# Patient Record
Sex: Male | Born: 1940 | Hispanic: No | Marital: Married | Smoking: Former smoker
Health system: Southern US, Community
[De-identification: ages and names within clinical notes are randomized; demographics above are authoritative.]

## PROBLEM LIST (undated history)

## (undated) DIAGNOSIS — D649 Anemia, unspecified: Secondary | ICD-10-CM

## (undated) DIAGNOSIS — Z8739 Personal history of other diseases of the musculoskeletal system and connective tissue: Secondary | ICD-10-CM

## (undated) DIAGNOSIS — I10 Essential (primary) hypertension: Secondary | ICD-10-CM

## (undated) DIAGNOSIS — D471 Chronic myeloproliferative disease: Secondary | ICD-10-CM

## (undated) DIAGNOSIS — I480 Paroxysmal atrial fibrillation: Secondary | ICD-10-CM

## (undated) HISTORY — DX: Personal history of other diseases of the musculoskeletal system and connective tissue: Z87.39

## (undated) HISTORY — DX: Chronic myeloproliferative disease: D47.1

## (undated) HISTORY — DX: Paroxysmal atrial fibrillation: I48.0

## (undated) HISTORY — DX: Essential (primary) hypertension: I10

## (undated) HISTORY — PX: OTHER SURGICAL HISTORY: SHX169

## (undated) HISTORY — DX: Anemia, unspecified: D64.9

---

## 2010-12-08 DIAGNOSIS — K319 Disease of stomach and duodenum, unspecified: Secondary | ICD-10-CM | POA: Insufficient documentation

## 2010-12-08 DIAGNOSIS — I10 Essential (primary) hypertension: Secondary | ICD-10-CM | POA: Insufficient documentation

## 2011-09-29 ENCOUNTER — Other Ambulatory Visit: Payer: Self-pay

## 2011-09-30 DIAGNOSIS — I4892 Unspecified atrial flutter: Secondary | ICD-10-CM

## 2011-10-01 DIAGNOSIS — I4892 Unspecified atrial flutter: Secondary | ICD-10-CM

## 2011-10-01 DIAGNOSIS — R079 Chest pain, unspecified: Secondary | ICD-10-CM

## 2011-10-19 ENCOUNTER — Other Ambulatory Visit: Payer: Self-pay | Admitting: *Deleted

## 2011-10-19 DIAGNOSIS — I4892 Unspecified atrial flutter: Secondary | ICD-10-CM

## 2011-10-22 DIAGNOSIS — I4892 Unspecified atrial flutter: Secondary | ICD-10-CM

## 2011-11-21 ENCOUNTER — Telehealth: Payer: Self-pay | Admitting: *Deleted

## 2011-11-21 ENCOUNTER — Encounter: Payer: Self-pay | Admitting: Cardiology

## 2011-11-21 NOTE — Telephone Encounter (Signed)
Wife informed

## 2011-11-21 NOTE — Telephone Encounter (Signed)
Message copied by Eustace Moore on Wed Nov 21, 2011  8:24 AM ------      Message from: Rande Brunt      Created: Tue Nov 20, 2011 11:04 AM       NSR

## 2011-11-27 ENCOUNTER — Ambulatory Visit: Payer: PRIVATE HEALTH INSURANCE | Admitting: Cardiology

## 2011-12-24 ENCOUNTER — Encounter: Payer: Self-pay | Admitting: Cardiology

## 2011-12-24 ENCOUNTER — Ambulatory Visit (INDEPENDENT_AMBULATORY_CARE_PROVIDER_SITE_OTHER): Payer: PRIVATE HEALTH INSURANCE | Admitting: Cardiology

## 2011-12-24 VITALS — BP 130/70 | HR 60 | Ht 76.0 in | Wt 230.0 lb

## 2011-12-24 DIAGNOSIS — I4891 Unspecified atrial fibrillation: Secondary | ICD-10-CM

## 2011-12-24 NOTE — Patient Instructions (Signed)
   No follow up needed. Your physician recommends that you continue on your current medications as directed. Please refer to the Current Medication list given to you today.

## 2011-12-24 NOTE — Progress Notes (Signed)
HPI The patient presents for followup of atrial fibrillation. He was admitted in May because of this. I reviewed extensively these hospital records. This was new onset. He had a low CHADS poor and so was not treated with anticoagulation. He did convert quickly to sinus rhythm once his rate was slowed with diltiazem. At that time he was anemic and this was thought to be perhaps a cause for his fibrillation. Was transfused what is a long standing chronic anemia. He did have some mild heart failure related to diastolic dysfunction.  Echo demonstrated preserved ejection fraction with no significant valvular abnormalities.  He did wear an event monitor post hospitalization which demonstrated no recurrent dysrhythmias.  Since his hospitalization he has had no further dysrhythmias. He denies any palpitations, presyncope or syncope. He has no chest pressure, neck or arm discomfort. He has had no weight gain or edema. He has had no shortness of breath, PND or orthopnea. He feels much better since his transfusions. He is followed in [place] for his anemia and by his primary provider and he reports having blood work since then.  No Known Allergies  Current Outpatient Prescriptions  Medication Sig Dispense Refill  . ALLOPURINOL PO Take 100 mg by mouth daily.       Marland Kitchen aspirin 81 MG tablet Take 81 mg by mouth daily.      . carvedilol (COREG) 12.5 MG tablet Take 12.5 mg by mouth 2 (two) times daily with a meal.      . ferrous fumarate (HEMOCYTE - 106 MG FE) 325 (106 FE) MG TABS Take 1 tablet by mouth daily.      . folic acid (FOLVITE) 1 MG tablet Take 1 mg by mouth daily.      . furosemide (LASIX) 20 MG tablet Take 40 mg by mouth daily as needed. Take 1-2 tablets daily as needed for edema       . LISINOPRIL PO Take 40 mg by mouth daily.       . Multiple Vitamin (MULTIVITAMIN) tablet Take 1 tablet by mouth daily.      Marland Kitchen PRAVASTATIN SODIUM PO Take 40 mg by mouth daily.       . Ranitidine HCl (ZANTAC PO) Take  75 mg by mouth daily.       . vitamin C (ASCORBIC ACID) 500 MG tablet Take 500 mg by mouth daily.        Past Medical History  Diagnosis Date  . Hypertension   . Anemia     He said teh cause has not been determined. His last transfusion requirement was about 4 years ago.  Marland Kitchen History of gout   . Paroxysmal atrial fibrillation     Past Surgical History  Procedure Date  . Right inguinal hernia repair     ROS:  As stated in the HPI and negative for all other systems.  PHYSICAL EXAM BP 130/70  Pulse 60  Ht 6\' 4"  (1.93 m)  Wt 230 lb (104.327 kg)  BMI 28.00 kg/m2 GENERAL:  Well appearing HEENT:  Pupils equal round and reactive, fundi not visualized, oral mucosa unremarkable NECK:  No jugular venous distention, waveform within normal limits, carotid upstroke brisk and symmetric, no bruits, no thyromegaly LYMPHATICS:  No cervical, inguinal adenopathy LUNGS:  Clear to auscultation bilaterally BACK:  No CVA tenderness CHEST:  Unremarkable HEART:  PMI not displaced or sustained,S1 and S2 within normal limits, no S3, no S4, no clicks, no rubs, no murmurs ABD:  Flat, positive bowel  sounds normal in frequency in pitch, no bruits, no rebound, no guarding, no midline pulsatile mass, no hepatomegaly, no splenomegaly EXT:  2 plus pulses throughout, no edema, no cyanosis no clubbing SKIN:  No rashes no nodules NEURO:  Cranial nerves II through XII grossly intact, motor grossly intact throughout PSYCH:  Cognitively intact, oriented to person place and time   ASSESSMENT AND PLAN  Atrial fibrillation -  The patient has had no recurrent dysrhythmias. He has a low thromboembolic risk. No further evaluation is indicated.  Diastolic heart failure  - The patient had evidence of this only related to his atrial fibrillation and profound anemia. He has no symptoms and is euvolemic. No further evaluation is necessary.  HTN - The blood pressure is at target. No change in medications is indicated. We  will continue with therapeutic lifestyle changes (TLC).

## 2012-01-15 ENCOUNTER — Telehealth: Payer: Self-pay | Admitting: Cardiology

## 2012-02-22 DIAGNOSIS — I5043 Acute on chronic combined systolic (congestive) and diastolic (congestive) heart failure: Secondary | ICD-10-CM

## 2012-03-13 ENCOUNTER — Encounter (INDEPENDENT_AMBULATORY_CARE_PROVIDER_SITE_OTHER): Payer: PRIVATE HEALTH INSURANCE | Admitting: Internal Medicine

## 2012-03-13 DIAGNOSIS — N289 Disorder of kidney and ureter, unspecified: Secondary | ICD-10-CM

## 2012-03-13 DIAGNOSIS — D509 Iron deficiency anemia, unspecified: Secondary | ICD-10-CM

## 2012-03-20 DIAGNOSIS — D509 Iron deficiency anemia, unspecified: Secondary | ICD-10-CM

## 2012-03-27 DIAGNOSIS — D509 Iron deficiency anemia, unspecified: Secondary | ICD-10-CM

## 2012-04-03 DIAGNOSIS — D509 Iron deficiency anemia, unspecified: Secondary | ICD-10-CM

## 2012-04-03 DIAGNOSIS — N189 Chronic kidney disease, unspecified: Secondary | ICD-10-CM

## 2012-04-18 ENCOUNTER — Other Ambulatory Visit: Payer: Self-pay

## 2012-05-01 DIAGNOSIS — I509 Heart failure, unspecified: Secondary | ICD-10-CM

## 2012-05-01 DIAGNOSIS — D509 Iron deficiency anemia, unspecified: Secondary | ICD-10-CM

## 2012-05-01 DIAGNOSIS — N189 Chronic kidney disease, unspecified: Secondary | ICD-10-CM

## 2012-05-01 DIAGNOSIS — D696 Thrombocytopenia, unspecified: Secondary | ICD-10-CM

## 2012-06-03 ENCOUNTER — Encounter (INDEPENDENT_AMBULATORY_CARE_PROVIDER_SITE_OTHER): Payer: PRIVATE HEALTH INSURANCE | Admitting: Internal Medicine

## 2012-06-03 DIAGNOSIS — D509 Iron deficiency anemia, unspecified: Secondary | ICD-10-CM

## 2012-06-03 DIAGNOSIS — D469 Myelodysplastic syndrome, unspecified: Secondary | ICD-10-CM

## 2012-06-03 DIAGNOSIS — N183 Chronic kidney disease, stage 3 (moderate): Secondary | ICD-10-CM

## 2012-06-10 DIAGNOSIS — D631 Anemia in chronic kidney disease: Secondary | ICD-10-CM

## 2012-06-10 DIAGNOSIS — N189 Chronic kidney disease, unspecified: Secondary | ICD-10-CM

## 2012-06-10 DIAGNOSIS — N039 Chronic nephritic syndrome with unspecified morphologic changes: Secondary | ICD-10-CM

## 2012-06-15 DIAGNOSIS — I509 Heart failure, unspecified: Secondary | ICD-10-CM | POA: Insufficient documentation

## 2012-06-15 DIAGNOSIS — E785 Hyperlipidemia, unspecified: Secondary | ICD-10-CM | POA: Insufficient documentation

## 2012-06-15 DIAGNOSIS — M109 Gout, unspecified: Secondary | ICD-10-CM | POA: Insufficient documentation

## 2012-06-15 DIAGNOSIS — I10 Essential (primary) hypertension: Secondary | ICD-10-CM | POA: Insufficient documentation

## 2012-06-25 ENCOUNTER — Encounter: Payer: Self-pay | Admitting: Internal Medicine

## 2012-07-16 ENCOUNTER — Encounter: Payer: Self-pay | Admitting: Internal Medicine

## 2012-07-16 DIAGNOSIS — D638 Anemia in other chronic diseases classified elsewhere: Secondary | ICD-10-CM

## 2012-07-16 DIAGNOSIS — N189 Chronic kidney disease, unspecified: Secondary | ICD-10-CM

## 2012-07-23 DIAGNOSIS — D469 Myelodysplastic syndrome, unspecified: Secondary | ICD-10-CM | POA: Insufficient documentation

## 2012-07-30 DIAGNOSIS — D638 Anemia in other chronic diseases classified elsewhere: Secondary | ICD-10-CM

## 2012-07-30 DIAGNOSIS — D4620 Refractory anemia with excess of blasts, unspecified: Secondary | ICD-10-CM

## 2012-08-06 DIAGNOSIS — N189 Chronic kidney disease, unspecified: Secondary | ICD-10-CM

## 2012-08-06 DIAGNOSIS — D631 Anemia in chronic kidney disease: Secondary | ICD-10-CM

## 2012-08-06 DIAGNOSIS — N039 Chronic nephritic syndrome with unspecified morphologic changes: Secondary | ICD-10-CM

## 2012-08-15 DIAGNOSIS — D638 Anemia in other chronic diseases classified elsewhere: Secondary | ICD-10-CM

## 2012-08-15 DIAGNOSIS — N189 Chronic kidney disease, unspecified: Secondary | ICD-10-CM

## 2012-08-15 DIAGNOSIS — D469 Myelodysplastic syndrome, unspecified: Secondary | ICD-10-CM

## 2012-08-22 ENCOUNTER — Encounter (INDEPENDENT_AMBULATORY_CARE_PROVIDER_SITE_OTHER): Payer: PRIVATE HEALTH INSURANCE

## 2012-08-22 DIAGNOSIS — D469 Myelodysplastic syndrome, unspecified: Secondary | ICD-10-CM

## 2012-08-22 DIAGNOSIS — M109 Gout, unspecified: Secondary | ICD-10-CM

## 2012-08-22 DIAGNOSIS — N189 Chronic kidney disease, unspecified: Secondary | ICD-10-CM

## 2012-08-22 DIAGNOSIS — I129 Hypertensive chronic kidney disease with stage 1 through stage 4 chronic kidney disease, or unspecified chronic kidney disease: Secondary | ICD-10-CM

## 2012-08-28 DIAGNOSIS — D631 Anemia in chronic kidney disease: Secondary | ICD-10-CM

## 2012-08-28 DIAGNOSIS — N189 Chronic kidney disease, unspecified: Secondary | ICD-10-CM

## 2012-08-28 DIAGNOSIS — N039 Chronic nephritic syndrome with unspecified morphologic changes: Secondary | ICD-10-CM

## 2012-09-11 ENCOUNTER — Encounter (INDEPENDENT_AMBULATORY_CARE_PROVIDER_SITE_OTHER): Payer: PRIVATE HEALTH INSURANCE | Admitting: Internal Medicine

## 2012-09-11 DIAGNOSIS — N039 Chronic nephritic syndrome with unspecified morphologic changes: Secondary | ICD-10-CM

## 2012-09-11 DIAGNOSIS — N184 Chronic kidney disease, stage 4 (severe): Secondary | ICD-10-CM

## 2012-09-11 DIAGNOSIS — D509 Iron deficiency anemia, unspecified: Secondary | ICD-10-CM

## 2012-09-11 DIAGNOSIS — D631 Anemia in chronic kidney disease: Secondary | ICD-10-CM

## 2012-10-02 DIAGNOSIS — D509 Iron deficiency anemia, unspecified: Secondary | ICD-10-CM

## 2012-10-02 DIAGNOSIS — N184 Chronic kidney disease, stage 4 (severe): Secondary | ICD-10-CM

## 2012-10-02 DIAGNOSIS — N039 Chronic nephritic syndrome with unspecified morphologic changes: Secondary | ICD-10-CM

## 2012-10-02 DIAGNOSIS — D631 Anemia in chronic kidney disease: Secondary | ICD-10-CM

## 2012-10-03 DIAGNOSIS — N184 Chronic kidney disease, stage 4 (severe): Secondary | ICD-10-CM | POA: Insufficient documentation

## 2012-10-03 DIAGNOSIS — I5022 Chronic systolic (congestive) heart failure: Secondary | ICD-10-CM | POA: Insufficient documentation

## 2012-10-03 DIAGNOSIS — I429 Cardiomyopathy, unspecified: Secondary | ICD-10-CM | POA: Insufficient documentation

## 2012-10-20 ENCOUNTER — Encounter (INDEPENDENT_AMBULATORY_CARE_PROVIDER_SITE_OTHER): Payer: PRIVATE HEALTH INSURANCE | Admitting: Internal Medicine

## 2012-10-20 DIAGNOSIS — D469 Myelodysplastic syndrome, unspecified: Secondary | ICD-10-CM

## 2012-10-20 DIAGNOSIS — D649 Anemia, unspecified: Secondary | ICD-10-CM

## 2012-10-20 DIAGNOSIS — N183 Chronic kidney disease, stage 3 unspecified: Secondary | ICD-10-CM

## 2012-10-20 DIAGNOSIS — D509 Iron deficiency anemia, unspecified: Secondary | ICD-10-CM

## 2012-12-15 ENCOUNTER — Encounter (INDEPENDENT_AMBULATORY_CARE_PROVIDER_SITE_OTHER): Payer: PRIVATE HEALTH INSURANCE

## 2012-12-15 DIAGNOSIS — I4891 Unspecified atrial fibrillation: Secondary | ICD-10-CM

## 2012-12-15 DIAGNOSIS — R161 Splenomegaly, not elsewhere classified: Secondary | ICD-10-CM

## 2012-12-15 DIAGNOSIS — D47Z9 Other specified neoplasms of uncertain behavior of lymphoid, hematopoietic and related tissue: Secondary | ICD-10-CM

## 2012-12-15 DIAGNOSIS — N189 Chronic kidney disease, unspecified: Secondary | ICD-10-CM

## 2012-12-15 DIAGNOSIS — Z7901 Long term (current) use of anticoagulants: Secondary | ICD-10-CM

## 2012-12-15 DIAGNOSIS — D649 Anemia, unspecified: Secondary | ICD-10-CM

## 2012-12-22 DIAGNOSIS — D649 Anemia, unspecified: Secondary | ICD-10-CM

## 2012-12-24 DIAGNOSIS — M6281 Muscle weakness (generalized): Secondary | ICD-10-CM

## 2012-12-29 DIAGNOSIS — D649 Anemia, unspecified: Secondary | ICD-10-CM

## 2013-01-05 DIAGNOSIS — N289 Disorder of kidney and ureter, unspecified: Secondary | ICD-10-CM

## 2013-01-05 DIAGNOSIS — D649 Anemia, unspecified: Secondary | ICD-10-CM

## 2013-01-15 ENCOUNTER — Encounter (INDEPENDENT_AMBULATORY_CARE_PROVIDER_SITE_OTHER): Payer: PRIVATE HEALTH INSURANCE

## 2013-01-15 DIAGNOSIS — N289 Disorder of kidney and ureter, unspecified: Secondary | ICD-10-CM

## 2013-01-15 DIAGNOSIS — D649 Anemia, unspecified: Secondary | ICD-10-CM

## 2013-01-22 DIAGNOSIS — N189 Chronic kidney disease, unspecified: Secondary | ICD-10-CM

## 2013-01-22 DIAGNOSIS — D649 Anemia, unspecified: Secondary | ICD-10-CM

## 2013-01-29 DIAGNOSIS — D649 Anemia, unspecified: Secondary | ICD-10-CM

## 2013-01-29 DIAGNOSIS — N189 Chronic kidney disease, unspecified: Secondary | ICD-10-CM

## 2013-02-05 ENCOUNTER — Encounter (INDEPENDENT_AMBULATORY_CARE_PROVIDER_SITE_OTHER): Payer: PRIVATE HEALTH INSURANCE

## 2013-02-05 DIAGNOSIS — D7581 Myelofibrosis: Secondary | ICD-10-CM

## 2013-02-05 DIAGNOSIS — R161 Splenomegaly, not elsewhere classified: Secondary | ICD-10-CM

## 2013-02-05 DIAGNOSIS — D649 Anemia, unspecified: Secondary | ICD-10-CM

## 2013-02-05 DIAGNOSIS — D696 Thrombocytopenia, unspecified: Secondary | ICD-10-CM

## 2013-02-12 DIAGNOSIS — D7581 Myelofibrosis: Secondary | ICD-10-CM

## 2013-02-12 DIAGNOSIS — D649 Anemia, unspecified: Secondary | ICD-10-CM

## 2013-02-12 DIAGNOSIS — N289 Disorder of kidney and ureter, unspecified: Secondary | ICD-10-CM

## 2013-02-19 DIAGNOSIS — N189 Chronic kidney disease, unspecified: Secondary | ICD-10-CM

## 2013-02-19 DIAGNOSIS — D649 Anemia, unspecified: Secondary | ICD-10-CM

## 2013-02-26 DIAGNOSIS — N189 Chronic kidney disease, unspecified: Secondary | ICD-10-CM

## 2013-02-26 DIAGNOSIS — D631 Anemia in chronic kidney disease: Secondary | ICD-10-CM

## 2013-02-26 DIAGNOSIS — N039 Chronic nephritic syndrome with unspecified morphologic changes: Secondary | ICD-10-CM

## 2013-03-04 ENCOUNTER — Encounter (INDEPENDENT_AMBULATORY_CARE_PROVIDER_SITE_OTHER): Payer: PRIVATE HEALTH INSURANCE

## 2013-03-04 DIAGNOSIS — R161 Splenomegaly, not elsewhere classified: Secondary | ICD-10-CM

## 2013-03-04 DIAGNOSIS — N189 Chronic kidney disease, unspecified: Secondary | ICD-10-CM

## 2013-03-04 DIAGNOSIS — D649 Anemia, unspecified: Secondary | ICD-10-CM

## 2013-03-04 DIAGNOSIS — D474 Osteomyelofibrosis: Secondary | ICD-10-CM

## 2013-03-04 DIAGNOSIS — N289 Disorder of kidney and ureter, unspecified: Secondary | ICD-10-CM

## 2013-03-12 DIAGNOSIS — N289 Disorder of kidney and ureter, unspecified: Secondary | ICD-10-CM

## 2013-03-12 DIAGNOSIS — D469 Myelodysplastic syndrome, unspecified: Secondary | ICD-10-CM

## 2013-03-19 ENCOUNTER — Encounter (INDEPENDENT_AMBULATORY_CARE_PROVIDER_SITE_OTHER): Payer: PRIVATE HEALTH INSURANCE

## 2013-03-19 DIAGNOSIS — N039 Chronic nephritic syndrome with unspecified morphologic changes: Secondary | ICD-10-CM

## 2013-03-19 DIAGNOSIS — N189 Chronic kidney disease, unspecified: Secondary | ICD-10-CM

## 2013-03-19 DIAGNOSIS — D649 Anemia, unspecified: Secondary | ICD-10-CM

## 2013-03-19 DIAGNOSIS — D631 Anemia in chronic kidney disease: Secondary | ICD-10-CM

## 2013-03-19 DIAGNOSIS — D474 Osteomyelofibrosis: Secondary | ICD-10-CM

## 2013-03-19 DIAGNOSIS — I428 Other cardiomyopathies: Secondary | ICD-10-CM

## 2013-03-19 DIAGNOSIS — N289 Disorder of kidney and ureter, unspecified: Secondary | ICD-10-CM

## 2013-03-27 DIAGNOSIS — N189 Chronic kidney disease, unspecified: Secondary | ICD-10-CM

## 2013-03-27 DIAGNOSIS — D649 Anemia, unspecified: Secondary | ICD-10-CM

## 2013-03-27 DIAGNOSIS — D7581 Myelofibrosis: Secondary | ICD-10-CM

## 2013-04-02 DIAGNOSIS — R161 Splenomegaly, not elsewhere classified: Secondary | ICD-10-CM

## 2013-04-02 DIAGNOSIS — D7581 Myelofibrosis: Secondary | ICD-10-CM

## 2013-04-03 DIAGNOSIS — N189 Chronic kidney disease, unspecified: Secondary | ICD-10-CM | POA: Insufficient documentation

## 2013-04-03 DIAGNOSIS — I2510 Atherosclerotic heart disease of native coronary artery without angina pectoris: Secondary | ICD-10-CM | POA: Insufficient documentation

## 2013-04-03 DIAGNOSIS — E119 Type 2 diabetes mellitus without complications: Secondary | ICD-10-CM | POA: Insufficient documentation

## 2013-04-08 DIAGNOSIS — D649 Anemia, unspecified: Secondary | ICD-10-CM

## 2013-04-08 DIAGNOSIS — D474 Osteomyelofibrosis: Secondary | ICD-10-CM

## 2013-04-21 ENCOUNTER — Encounter (INDEPENDENT_AMBULATORY_CARE_PROVIDER_SITE_OTHER): Payer: PRIVATE HEALTH INSURANCE

## 2013-04-21 DIAGNOSIS — N189 Chronic kidney disease, unspecified: Secondary | ICD-10-CM

## 2013-04-21 DIAGNOSIS — D7581 Myelofibrosis: Secondary | ICD-10-CM

## 2013-04-21 DIAGNOSIS — R161 Splenomegaly, not elsewhere classified: Secondary | ICD-10-CM

## 2013-04-21 DIAGNOSIS — R7989 Other specified abnormal findings of blood chemistry: Secondary | ICD-10-CM

## 2013-09-11 DIAGNOSIS — D6959 Other secondary thrombocytopenia: Secondary | ICD-10-CM | POA: Insufficient documentation

## 2013-11-18 DIAGNOSIS — E039 Hypothyroidism, unspecified: Secondary | ICD-10-CM | POA: Insufficient documentation

## 2014-02-24 DIAGNOSIS — E032 Hypothyroidism due to medicaments and other exogenous substances: Secondary | ICD-10-CM | POA: Insufficient documentation

## 2014-05-21 DIAGNOSIS — D474 Osteomyelofibrosis: Secondary | ICD-10-CM | POA: Insufficient documentation

## 2014-05-21 DIAGNOSIS — D471 Chronic myeloproliferative disease: Secondary | ICD-10-CM | POA: Insufficient documentation

## 2014-08-25 DIAGNOSIS — R161 Splenomegaly, not elsewhere classified: Secondary | ICD-10-CM | POA: Insufficient documentation

## 2014-10-14 DIAGNOSIS — I272 Pulmonary hypertension, unspecified: Secondary | ICD-10-CM | POA: Insufficient documentation

## 2014-10-14 DIAGNOSIS — G4730 Sleep apnea, unspecified: Secondary | ICD-10-CM | POA: Insufficient documentation

## 2014-10-15 DIAGNOSIS — I97190 Other postprocedural cardiac functional disturbances following cardiac surgery: Secondary | ICD-10-CM

## 2014-10-15 DIAGNOSIS — I442 Atrioventricular block, complete: Secondary | ICD-10-CM | POA: Insufficient documentation

## 2014-10-15 DIAGNOSIS — Z950 Presence of cardiac pacemaker: Secondary | ICD-10-CM | POA: Insufficient documentation

## 2014-12-24 DIAGNOSIS — D638 Anemia in other chronic diseases classified elsewhere: Secondary | ICD-10-CM | POA: Insufficient documentation

## 2015-02-24 DIAGNOSIS — I5042 Chronic combined systolic (congestive) and diastolic (congestive) heart failure: Secondary | ICD-10-CM | POA: Insufficient documentation

## 2015-06-21 DIAGNOSIS — I498 Other specified cardiac arrhythmias: Secondary | ICD-10-CM | POA: Insufficient documentation

## 2015-09-28 DIAGNOSIS — D649 Anemia, unspecified: Secondary | ICD-10-CM | POA: Insufficient documentation

## 2015-11-12 DIAGNOSIS — E872 Acidosis, unspecified: Secondary | ICD-10-CM | POA: Insufficient documentation

## 2015-11-25 ENCOUNTER — Encounter (HOSPITAL_COMMUNITY): Payer: Self-pay | Admitting: Hematology

## 2015-11-25 ENCOUNTER — Encounter (HOSPITAL_COMMUNITY): Payer: Medicare HMO | Attending: Hematology | Admitting: Hematology

## 2015-11-25 VITALS — BP 135/54 | HR 63 | Temp 97.7°F | Resp 18 | Ht 75.0 in | Wt 205.0 lb

## 2015-11-25 DIAGNOSIS — R161 Splenomegaly, not elsewhere classified: Secondary | ICD-10-CM

## 2015-11-25 DIAGNOSIS — Z9289 Personal history of other medical treatment: Secondary | ICD-10-CM | POA: Insufficient documentation

## 2015-11-25 DIAGNOSIS — D471 Chronic myeloproliferative disease: Secondary | ICD-10-CM | POA: Diagnosis not present

## 2015-11-25 DIAGNOSIS — Z8719 Personal history of other diseases of the digestive system: Secondary | ICD-10-CM | POA: Insufficient documentation

## 2015-11-25 DIAGNOSIS — D638 Anemia in other chronic diseases classified elsewhere: Secondary | ICD-10-CM

## 2015-11-25 DIAGNOSIS — D696 Thrombocytopenia, unspecified: Secondary | ICD-10-CM

## 2015-11-25 DIAGNOSIS — J45909 Unspecified asthma, uncomplicated: Secondary | ICD-10-CM | POA: Insufficient documentation

## 2015-11-25 DIAGNOSIS — D469 Myelodysplastic syndrome, unspecified: Secondary | ICD-10-CM | POA: Insufficient documentation

## 2015-11-25 DIAGNOSIS — I480 Paroxysmal atrial fibrillation: Secondary | ICD-10-CM

## 2015-11-25 DIAGNOSIS — I2510 Atherosclerotic heart disease of native coronary artery without angina pectoris: Secondary | ICD-10-CM

## 2015-11-25 DIAGNOSIS — N184 Chronic kidney disease, stage 4 (severe): Secondary | ICD-10-CM

## 2015-11-25 DIAGNOSIS — E039 Hypothyroidism, unspecified: Secondary | ICD-10-CM

## 2015-11-25 DIAGNOSIS — D731 Hypersplenism: Secondary | ICD-10-CM

## 2015-11-25 DIAGNOSIS — Z87891 Personal history of nicotine dependence: Secondary | ICD-10-CM

## 2015-11-25 DIAGNOSIS — D631 Anemia in chronic kidney disease: Secondary | ICD-10-CM

## 2015-11-25 DIAGNOSIS — E119 Type 2 diabetes mellitus without complications: Secondary | ICD-10-CM

## 2015-11-25 DIAGNOSIS — I5040 Unspecified combined systolic (congestive) and diastolic (congestive) heart failure: Secondary | ICD-10-CM

## 2015-11-25 DIAGNOSIS — Z9889 Other specified postprocedural states: Secondary | ICD-10-CM

## 2015-11-25 HISTORY — DX: Chronic myeloproliferative disease: D47.1

## 2015-11-25 NOTE — Patient Instructions (Addendum)
[place] at [facility] Discharge Instructions  RECOMMENDATIONS MADE BY THE CONSULTANT AND ANY TEST RESULTS WILL BE SENT TO YOUR REFERRING PHYSICIAN.  You were seen by Dr. Irene Limbo today. Return on Monday for labs You may need as needed Transfusions Return to Clinic every month with labs. Please call the clinic with any concerns.  Thank you for choosing [place] at [facility] to provide your oncology and hematology care.  To afford each patient quality time with our provider, please arrive at least 15 minutes before your scheduled appointment time.   Beginning January 23rd 2017 lab work for the [facility] will be done in the  Main lab at [facility] on 1st floor. If you have a lab appointment with the [facility] please come in thru the  Main Entrance and check in at the main information desk  You need to re-schedule your appointment should you arrive 10 or more minutes late.  We strive to give you quality time with our providers, and arriving late affects you and other patients whose appointments are after yours.  Also, if you no show three or more times for appointments you may be dismissed from the clinic at the providers discretion.     Again, thank you for choosing [facility].  Our hope is that these requests will decrease the amount of time that you wait before being seen by our physicians.       _____________________________________________________________  Should you have questions after your visit to [facility], please contact our office at [phone] between the hours of 8:30 a.m. and 4:30 p.m.  Voicemails left after 4:30 p.m. will not be returned until the following business day.  For prescription refill requests, have your pharmacy contact our office.         Resources For Cancer Patients and their Caregivers ? American Cancer Society: Can assist with transportation, wigs, general needs, runs  Look Good Feel Better.        ([phone] ? Cancer Care: Provides financial assistance, online support groups, medication/co-pay assistance.  1-800-813-HOPE (669)098-5216) ? [place] Assists [place] cancer patients and their families through emotional , educational and financial support.  201-611-0273 ? [place] DSS Where to apply for food stamps, Medicaid and utility assistance. 507-619-3000 ? RCATS: Transportation to medical appointments. (279) 205-3278 ? Social Security Administration: May apply for disability if have a Stage IV cancer. ([phone] [phone] ? [employer], Disability and Transit Services: Assists with nutrition, care and transit needs. [place] Support Programs: @10RELATIVEDAYS @ > Cancer Support Group  2nd Tuesday of the month 1pm-2pm, Journey Room  > Creative Journey  3rd Tuesday of the month 1130am-1pm, Journey Room  > Look Good Feel Better  1st Wednesday of the month 10am-12 noon, Journey Room (Call [place] to register [phone])

## 2015-11-28 ENCOUNTER — Encounter (HOSPITAL_COMMUNITY): Payer: Medicare HMO

## 2015-11-28 ENCOUNTER — Other Ambulatory Visit (HOSPITAL_COMMUNITY): Payer: Medicare HMO

## 2015-11-28 DIAGNOSIS — D469 Myelodysplastic syndrome, unspecified: Secondary | ICD-10-CM | POA: Diagnosis not present

## 2015-11-28 DIAGNOSIS — D471 Chronic myeloproliferative disease: Secondary | ICD-10-CM

## 2015-11-28 LAB — CBC WITH DIFFERENTIAL/PLATELET
BASOS ABS: 0 10*3/uL (ref 0.0–0.1)
BASOS PCT: 0 %
Eosinophils Absolute: 0 10*3/uL (ref 0.0–0.7)
Eosinophils Relative: 0 %
HEMATOCRIT: 24 % — AB (ref 39.0–52.0)
Hemoglobin: 7.7 g/dL — ABNORMAL LOW (ref 13.0–17.0)
LYMPHS PCT: 14 %
Lymphs Abs: 0.5 10*3/uL — ABNORMAL LOW (ref 0.7–4.0)
MCH: 29.3 pg (ref 26.0–34.0)
MCHC: 32.1 g/dL (ref 30.0–36.0)
MCV: 91.3 fL (ref 78.0–100.0)
MONOS PCT: 1 %
Monocytes Absolute: 0 10*3/uL — ABNORMAL LOW (ref 0.1–1.0)
NEUTROS ABS: 3.2 10*3/uL (ref 1.7–7.7)
Neutrophils Relative %: 85 %
PLATELETS: 78 10*3/uL — AB (ref 150–400)
RBC: 2.63 MIL/uL — ABNORMAL LOW (ref 4.22–5.81)
RDW: 26 % — ABNORMAL HIGH (ref 11.5–15.5)
WBC: 3.7 10*3/uL — ABNORMAL LOW (ref 4.0–10.5)

## 2015-11-28 LAB — COMPREHENSIVE METABOLIC PANEL
ALBUMIN: 2.9 g/dL — AB (ref 3.5–5.0)
ALT: 25 U/L (ref 17–63)
AST: 18 U/L (ref 15–41)
Alkaline Phosphatase: 73 U/L (ref 38–126)
Anion gap: 3 — ABNORMAL LOW (ref 5–15)
BUN: 66 mg/dL — AB (ref 6–20)
CHLORIDE: 118 mmol/L — AB (ref 101–111)
CO2: 18 mmol/L — AB (ref 22–32)
CREATININE: 2.85 mg/dL — AB (ref 0.61–1.24)
Calcium: 7.6 mg/dL — ABNORMAL LOW (ref 8.9–10.3)
GFR calc Af Amer: 24 mL/min — ABNORMAL LOW (ref >60)
GFR calc non Af Amer: 20 mL/min — ABNORMAL LOW (ref >60)
GLUCOSE: 123 mg/dL — AB (ref 65–99)
Potassium: 4.2 mmol/L (ref 3.5–5.1)
SODIUM: 139 mmol/L (ref 135–145)
Total Bilirubin: 0.4 mg/dL (ref 0.3–1.2)
Total Protein: 5.5 g/dL — ABNORMAL LOW (ref 6.5–8.1)

## 2015-11-28 LAB — FERRITIN: Ferritin: 2687 ng/mL — ABNORMAL HIGH (ref 24–336)

## 2015-11-28 LAB — LACTATE DEHYDROGENASE: LDH: 1205 U/L — ABNORMAL HIGH (ref 98–192)

## 2015-11-29 LAB — ERYTHROPOIETIN: Erythropoietin: 35.4 m[IU]/mL — ABNORMAL HIGH (ref 2.6–18.5)

## 2015-12-08 ENCOUNTER — Ambulatory Visit (HOSPITAL_COMMUNITY)
Admission: RE | Admit: 2015-12-08 | Discharge: 2015-12-08 | Disposition: A | Discharge status: Home / Self Care | Payer: Medicare HMO | Source: Ambulatory Visit | Attending: Hematology | Admitting: Hematology

## 2015-12-08 DIAGNOSIS — R188 Other ascites: Secondary | ICD-10-CM | POA: Diagnosis not present

## 2015-12-08 DIAGNOSIS — D469 Myelodysplastic syndrome, unspecified: Secondary | ICD-10-CM | POA: Insufficient documentation

## 2015-12-08 DIAGNOSIS — D471 Chronic myeloproliferative disease: Secondary | ICD-10-CM

## 2015-12-08 DIAGNOSIS — N281 Cyst of kidney, acquired: Secondary | ICD-10-CM | POA: Diagnosis not present

## 2015-12-26 ENCOUNTER — Encounter (HOSPITAL_COMMUNITY): Payer: Medicare HMO

## 2015-12-26 ENCOUNTER — Encounter (HOSPITAL_COMMUNITY): Payer: Self-pay | Admitting: Oncology

## 2015-12-26 ENCOUNTER — Encounter (HOSPITAL_COMMUNITY): Payer: Medicare HMO | Attending: Hematology | Admitting: Oncology

## 2015-12-26 DIAGNOSIS — D471 Chronic myeloproliferative disease: Secondary | ICD-10-CM | POA: Insufficient documentation

## 2015-12-26 DIAGNOSIS — R197 Diarrhea, unspecified: Secondary | ICD-10-CM

## 2015-12-26 DIAGNOSIS — D469 Myelodysplastic syndrome, unspecified: Secondary | ICD-10-CM

## 2015-12-26 DIAGNOSIS — R627 Adult failure to thrive: Secondary | ICD-10-CM

## 2015-12-26 DIAGNOSIS — K529 Noninfective gastroenteritis and colitis, unspecified: Secondary | ICD-10-CM

## 2015-12-26 DIAGNOSIS — R630 Anorexia: Secondary | ICD-10-CM

## 2015-12-26 LAB — COMPREHENSIVE METABOLIC PANEL
ALK PHOS: 74 U/L (ref 38–126)
ALT: 21 U/L (ref 17–63)
AST: 21 U/L (ref 15–41)
Albumin: 3.3 g/dL — ABNORMAL LOW (ref 3.5–5.0)
Anion gap: 8 (ref 5–15)
BILIRUBIN TOTAL: 0.7 mg/dL (ref 0.3–1.2)
BUN: 65 mg/dL — AB (ref 6–20)
CO2: 20 mmol/L — ABNORMAL LOW (ref 22–32)
CREATININE: 2.65 mg/dL — AB (ref 0.61–1.24)
Calcium: 8 mg/dL — ABNORMAL LOW (ref 8.9–10.3)
Chloride: 115 mmol/L — ABNORMAL HIGH (ref 101–111)
GFR calc Af Amer: 26 mL/min — ABNORMAL LOW (ref >60)
GFR, EST NON AFRICAN AMERICAN: 22 mL/min — AB (ref >60)
Glucose, Bld: 119 mg/dL — ABNORMAL HIGH (ref 65–99)
Potassium: 3.5 mmol/L (ref 3.5–5.1)
Sodium: 143 mmol/L (ref 135–145)
TOTAL PROTEIN: 6 g/dL — AB (ref 6.5–8.1)

## 2015-12-26 LAB — CBC WITH DIFFERENTIAL/PLATELET
BASOS PCT: 0 %
Basophils Absolute: 0 10*3/uL (ref 0.0–0.1)
EOS ABS: 0 10*3/uL (ref 0.0–0.7)
EOS PCT: 0 %
HEMATOCRIT: 32.1 % — AB (ref 39.0–52.0)
Hemoglobin: 10.3 g/dL — ABNORMAL LOW (ref 13.0–17.0)
LYMPHS PCT: 11 %
Lymphs Abs: 0.5 10*3/uL — ABNORMAL LOW (ref 0.7–4.0)
MCH: 29.4 pg (ref 26.0–34.0)
MCHC: 32.1 g/dL (ref 30.0–36.0)
MCV: 91.7 fL (ref 78.0–100.0)
Monocytes Absolute: 0.2 10*3/uL (ref 0.1–1.0)
Monocytes Relative: 4 %
NEUTROS PCT: 85 %
Neutro Abs: 3.6 10*3/uL (ref 1.7–7.7)
PLATELETS: 85 10*3/uL — AB (ref 150–400)
RBC: 3.5 MIL/uL — ABNORMAL LOW (ref 4.22–5.81)
RDW: 24.4 % — AB (ref 11.5–15.5)
WBC MORPHOLOGY: INCREASED
WBC: 4.3 10*3/uL (ref 4.0–10.5)

## 2015-12-26 LAB — FERRITIN: Ferritin: 3446 ng/mL — ABNORMAL HIGH (ref 24–336)

## 2015-12-26 MED ORDER — DEFERIPRONE 500 MG PO TABS: ORAL_TABLET | ORAL | 3 refills | Status: DC

## 2015-12-26 MED ORDER — LOPERAMIDE HCL 2 MG PO CAPS: 2.0000 mg | ORAL_CAPSULE | ORAL | 0 refills | Status: DC | PRN

## 2015-12-26 NOTE — Progress Notes (Signed)
[place] @ [facility] Telephone:([phone]) [phone]  Fax:(336) [place]  Nicolas Gibson OB: [birth year]  MR#: [id number]  GLO#:[id number]  Patient Care Team: [name], MD as PCP - General (Internal Medicine)  CHIEF COMPLAINT: No chief complaint on file.   VISIT DIAGNOSIS:     ICD-9-CM ICD-10-CM   1. 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 Diarrhea, unspecified type 787.91 R19.7 loperamide (IMODIUM) 2 MG capsule       Primary myelofibrosis ([place])   11/25/2015 Initial Diagnosis    Primary myelofibrosis (HCC)      No flowsheet data found.  INTERVAL HISTORY: 75 year old African-American gentleman came today to see [place] for chronic myeloproliferative disease which has been checked to positive.  Patient has been seen previously in this clinic and no notes are available at present time. So information has been off pain from [facility]. Patient has been seen previously at [facility] was admitted there for complete evaluation regarding chronic diarrhea and has been treated what appears to be antibiotic therapy for C. difficile (needs to be confirmed) Patient also has been previously treated   With JAKAFI and because of poor tolerance has been discontinued Patient is transfusion dependent had received 4 units of packed cells and [facility] (records are being obtained) Patient also has iron overload in getting ferriprox (according to the patient patient has been on for last 2 years) Feeling weak and tired poor appetite.  No nausea no vomiting.  Diarrhea persists. REVIEW OF SYSTEMS:   Gen. status: Feeling weak and tired. HEENT: Denies any headache dizziness. Cardiac: patient does have a pacemaker has a history of congestive heart failure GI: Abdominal discomfort.  Chronic diarrhea.  Previously responded to antibiotic therapy as well as to Imodium.  Had a complete workup done at [facility] trying to get all the  formation from [facility] regarding his admission in July. Lower extremity swelling Neurological system dizziness particularly when his hemoglobin goes down Skin: No rash    As per HPI. Otherwise, a complete review of systems is negatve.  PAST MEDICAL HISTORY: Past Medical History:  Diagnosis Date  . Anemia    He said teh cause has not been determined. His last transfusion requirement was about 4 years ago.  Marland Kitchen History of gout   . Hypertension   . Paroxysmal atrial fibrillation ([place])     PAST SURGICAL HISTORY: Past Surgical History:  Procedure Laterality Date  . Right inguinal hernia repair      FAMILY HISTORY Family History  Problem Relation Age of Onset  . Heart failure Mother   . Hypertension Mother   . Kidney failure Father   . Hypertension Father   . Diabetes Brother   . Hypertension Brother     GYNECOLOGIC HISTORY:  No LMP for male patient.     ADVANCED DIRECTIVES:    HEALTH MAINTENANCE: Social History  Substance Use Topics  . Smoking status: Former Smoker    Types: Cigarettes    Quit date: 05/14/1981  . Smokeless tobacco: Not on file  . Alcohol use No     Colonoscopy:  PAP:  Bone density:  Lipid panel:  Allergies  Allergen Reactions  . Lisinopril Other (See Comments)    hyperkalemia hyperkalemia    Current Outpatient Prescriptions  Medication Sig Dispense Refill  . allopurinol (ZYLOPRIM) 100 MG tablet Take by mouth.    Marland Kitchen aspirin EC 81 MG tablet Take by mouth.    Marland Kitchen  Deferiprone 500 MG TABS Take by mouth.    . folic acid (FOLVITE) 1 MG tablet Take by mouth.    . hydrALAZINE (APRESOLINE) 50 MG tablet     . isosorbide dinitrate (ISORDIL) 20 MG tablet Take by mouth.    . levothyroxine (SYNTHROID, LEVOTHROID) 88 MCG tablet Take by mouth.    . loperamide (IMODIUM) 2 MG capsule Take 1 capsule (2 mg total) by mouth every 4 (four) hours as needed for diarrhea or loose stools. 90 capsule 0  . metoprolol succinate (TOPROL-XL) 100 MG 24 hr  tablet     . pravastatin (PRAVACHOL) 40 MG tablet Take by mouth.    . ranitidine (ZANTAC) 150 MG tablet Take by mouth.    . sodium bicarbonate 650 MG tablet Take by mouth.    . torsemide (DEMADEX) 20 MG tablet Take by mouth.     No current facility-administered medications for this visit.     OBJECTIVE: PHYSICAL EXAM: Patient is alert oriented not any acute distress Lymphatic system: Supraclavicular, cervical, axillary, inguinal lymph nodes are not palpable HEENT: Pale looking sclerae are no jaundice Cardiac: Soft systolic murmur.  Irregular heart sound patient has pacemaker GI: Abdomen soft liver and spleen palpable Lower extremity edema Neurological system yet: No abnormality detected.. Skin: No rash All other systems have been examined  There were no vitals filed for this visit.   There is no height or weight on file to calculate BMI.    ECOG FS:2 - Symptomatic, <50% confined to bed  LAB RESULTS:  No visits with results within 5 Day(s) from this visit.  Latest known visit with results is:  Appointment on 11/28/2015  Component Date Value Ref Range Status  . WBC 11/28/2015 3.7* 4.0 - 10.5 K/uL Final  . RBC 11/28/2015 2.63* 4.22 - 5.81 MIL/uL Final  . Hemoglobin 11/28/2015 7.7* 13.0 - 17.0 g/dL Final  . HCT 11/28/2015 24.0* 39.0 - 52.0 % Final  . MCV 11/28/2015 91.3  78.0 - 100.0 fL Final  . MCH 11/28/2015 29.3  26.0 - 34.0 pg Final  . MCHC 11/28/2015 32.1  30.0 - 36.0 g/dL Final  . RDW 11/28/2015 26.0* 11.5 - 15.5 % Final  . Platelets 11/28/2015 78* 150 - 400 K/uL Final  . Neutrophils Relative % 11/28/2015 85  % Final  . Lymphocytes Relative 11/28/2015 14  % Final  . Monocytes Relative 11/28/2015 1  % Final  . Eosinophils Relative 11/28/2015 0  % Final  . Basophils Relative 11/28/2015 0  % Final  . Neutro Abs 11/28/2015 3.2  1.7 - 7.7 K/uL Final  . Lymphs Abs 11/28/2015 0.5* 0.7 - 4.0 K/uL Final  . Monocytes Absolute 11/28/2015 0.0* 0.1 - 1.0 K/uL Final  . Eosinophils  Absolute 11/28/2015 0.0  0.0 - 0.7 K/uL Final  . Basophils Absolute 11/28/2015 0.0  0.0 - 0.1 K/uL Final  . RBC Morphology 11/28/2015 POLYCHROMASIA PRESENT   Final   Comment: TEARDROP CELLS ELLIPTOCYTES SICKLE CELLS SCHISTOCYTES NOTED ON SMEAR   . WBC Morphology 11/28/2015 WHITE COUNT CONFIRMED ON SMEAR   Final  . Smear Review 11/28/2015 PLATELET COUNT CONFIRMED BY SMEAR   Final   Comment: LARGE PLATELETS PRESENT GIANT PLATELETS SEEN SPECIMEN CHECKED FOR CLOTS   . Sodium 11/28/2015 139  135 - 145 mmol/L Final  . Potassium 11/28/2015 4.2  3.5 - 5.1 mmol/L Final  . Chloride 11/28/2015 118* 101 - 111 mmol/L Final  . CO2 11/28/2015 18* 22 - 32 mmol/L Final  . Glucose, Bld 11/28/2015  123* 65 - 99 mg/dL Final  . BUN 11/28/2015 66* 6 - 20 mg/dL Final  . Creatinine, Ser 11/28/2015 2.85* 0.61 - 1.24 mg/dL Final  . Calcium 11/28/2015 7.6* 8.9 - 10.3 mg/dL Final  . Total Protein 11/28/2015 5.5* 6.5 - 8.1 g/dL Final  . Albumin 11/28/2015 2.9* 3.5 - 5.0 g/dL Final  . AST 11/28/2015 18  15 - 41 U/L Final  . ALT 11/28/2015 25  17 - 63 U/L Final  . Alkaline Phosphatase 11/28/2015 73  38 - 126 U/L Final  . Total Bilirubin 11/28/2015 0.4  0.3 - 1.2 mg/dL Final  . GFR calc non Af Amer 11/28/2015 20* >60 mL/min Final  . GFR calc Af Amer 11/28/2015 24* >60 mL/min Final   Comment: (NOTE) The eGFR has been calculated using the CKD EPI equation. This calculation has not been validated in all clinical situations. eGFR's persistently <60 mL/min signify possible Chronic Kidney Disease.   . Anion gap 11/28/2015 3* 5 - 15 Final  . LDH 11/28/2015 1205* 98 - 192 U/L Final  . Ferritin 11/28/2015 2687* 24 - 336 ng/mL Final  . Erythropoietin 11/29/2015 35.4* 2.6 - 18.5 mIU/mL Final   Comment: (NOTE) Performed At: [facility] [place], [place] [id number] Lindon Romp MD WU:[id number]      STUDIES: US Abdomen Complete  Result Date: 12/08/2015 CLINICAL DATA:  Iron overload,  myelodysplastic syndrome for the past 5 years, coronary artery disease, CHF, hyperlipidemia, diabetes. , follow-up splenomegaly EXAM: ABDOMEN ULTRASOUND COMPLETE COMPARISON:  Abdominal ultrasound of August 16, 2014 FINDINGS: Gallbladder: No gallstones or wall thickening visualized. No sonographic Murphy sign noted by sonographer. Common bile duct: Diameter: 6.9 mm Liver: The hepatic echotexture appears normal. A small amount of ascites lies adjacent to the liver. There is no focal hepatic mass nor ductal dilation. IVC: No abnormality visualized. Pancreas: Visualized portion unremarkable. Spleen: Further increase in size of the spleen which measures 18.4 x 19.3 x 9.7 cm with a calculated volume of 1806 cc. Right Kidney: Length: 11.7 cm. The renal cortical echotexture is increased and is equal tool that of the adjacent liver. There is a cortical cyst measuring 1 cm in diameter in the midpole. There is a irregular hypoechoic region in the upper pole measuring 2 x 1.7 x 1.1 cm. There is no hydronephrosis. Left Kidney: Length: 11.8 cm . The cortical echotexture is increased. There are vascular structures between the spleen and liver which may reflect splenorenal varices. Abdominal aorta: Limited visualization of the aorta. There is mural plaque. Other findings: None. IMPRESSION: 1. Interval increase in the volume of the spleen which now all measures 1806 cc. 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 2. Increased renal cortical echotexture bilaterally. Cystic structures in the right kidney with an upper pole complex appearing structure not well characterized. A dedicated renal ultrasound examination is recommended. 3. Normal appearance of the gallbladder and liver and pancreas. 4. Trace of ascites adjacent to the liver. Electronically Signed   By: David  Martinique M.D.   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 2.  Iron overload Continue present management 3.  Chronic diarrhea etiology not clear Records  are being obtained from [facility] for last admission for complete GI workup Will continue Imodium 5.  Failure to thrive and poor appetite related to multiple medical illnesses Patient will be evaluated in 3 weeks or before if needed  PLAN:  No problem-specific Assessment & Plan notes found for this encounter.   Patient expressed understanding and was in agreement with this plan. He also understands that He can call clinic at any time with any questions, concerns, or complaints.    No matching staging information was found for the patient.  Forest Gleason, MD   12/26/2015 2:17 PM

## 2015-12-26 NOTE — Patient Instructions (Addendum)
[place] at [facility] Discharge Instructions  RECOMMENDATIONS MADE BY THE CONSULTANT AND ANY TEST RESULTS WILL BE SENT TO YOUR REFERRING PHYSICIAN.  Labs today  Continue the Ferriprox.   Call us if you feel weak before coming back in 3 weeks.  Labs and MD visit in 3 weeks.   Take the Imodium for diarrhea as directed.  Thank you for choosing [place] at [facility] to provide your oncology and hematology care.  To afford each patient quality time with our provider, please arrive at least 15 minutes before your scheduled appointment time.   Beginning January 23rd 2017 lab work for the [facility] will be done in the  Main lab at [facility] on 1st floor. If you have a lab appointment with the [facility] please come in thru the  Main Entrance and check in at the main information desk  You need to re-schedule your appointment should you arrive 10 or more minutes late.  We strive to give you quality time with our providers, and arriving late affects you and other patients whose appointments are after yours.  Also, if you no show three or more times for appointments you may be dismissed from the clinic at the providers discretion.     Again, thank you for choosing [facility].  Our hope is that these requests will decrease the amount of time that you wait before being seen by our physicians.       _____________________________________________________________  Should you have questions after your visit to [facility], please contact our office at [phone] between the hours of 8:30 a.m. and 4:30 p.m.  Voicemails left after 4:30 p.m. will not be returned until the following business day.  For prescription refill requests, have your pharmacy contact our office.         Resources For Cancer Patients and their Caregivers ? American Cancer Society: Can assist with transportation, wigs, general needs, runs  Look Good Feel Better.        [phone] [phone] ? Cancer Care: Provides financial assistance, online support groups, medication/co-pay assistance.  1-800-813-HOPE 479 769 9413) ? [place] Assists [place] cancer patients and their families through emotional , educational and financial support.  979-371-1134 ? [place] DSS Where to apply for food stamps, Medicaid and utility assistance. (930)167-4477 ? RCATS: Transportation to medical appointments. 267-654-9846 ? Social Security Administration: May apply for disability if have a Stage IV cancer. ([phone] ? [employer], Disability and Transit Services: Assists with nutrition, care and transit needs. [place] Support Programs: @10RELATIVEDAYS @ > Cancer Support Group  2nd Tuesday of the month 1pm-2pm, Journey Room  > Creative Journey  3rd Tuesday of the month 1130am-1pm, Journey Room  > Look Good Feel Better  1st Wednesday of the month 10am-12 noon, Journey Room (Call [place] to register [phone])

## 2016-01-10 NOTE — Progress Notes (Signed)
Marland Kitchen    HEMATOLOGY/ONCOLOGY CONSULTATION NOTE  Date of Service: .11/25/2015  Patient Care Team: [name], MD as PCP - General (Internal Medicine)  CHIEF COMPLAINTS/PURPOSE OF CONSULTATION:   Continue management of primary myelofibrosis  HISTORY OF PRESENTING ILLNESS:   Nicolas Gibson is a wonderful 75 y.o. male who has been referred to [place] by Dr Becky Sax MD for evaluation and management of Primary myelofibrosis.  Patient has a history of Jak2 positive primary myelofibrosis diagnosed about 10 years ago and previously treated with Jakafi until about November 2016 when it was discontinued due to toxicity. He has a history of transfusion iron overload and is requiring 1-2 units of PRBCs monthly and is currently transfusion dependent. He is on deferiprone for his transfusion iron overload (with concurrent chronic kidney disease). He was previously being treated by Dr Janae Sauce and was last seen my him on 09/28/2015 .  he has also had chronic thrombocytopenia with the last platelet count being 82k in May 2017 . 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  Patient follows at [facility] for his cardiology cares for combined systolic and diastolic CHF and received his last PRBC transfusion of 2 units of PRBCs at [facility] on 11/12/2015.  Patient notes that he is doing okay today and does not have any overt distant exertion shortness of breath or chest pain or dizziness. His hemoglobin today is 7.7 with platelet count of 78k. No abdominal pain or overdistention at this time.   MEDICAL HISTORY:  Past Medical History:  Diagnosis Date  . Anemia    He said teh cause has not been determined. His last transfusion requirement was about 4 years ago.  Marland Kitchen History of gout   . Hypertension   . Paroxysmal atrial fibrillation (HCC)   -History of Jak2 positive primary myelofibrosis for 10 years - requiring PRBC transfusions for about the last 9 years. Currently  transfusion-dependent needing one to 2 units of PRBCs every month. 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   SURGICAL HISTORY: Past Surgical History:  Procedure Laterality Date  . Right inguinal hernia repair      SOCIAL HISTORY: Social History   Social History  . Marital status: Married    Spouse name: N/A  . Number of children: 1  . Years of education: N/A   Occupational History  . Retired    Social History Main Topics  . Smoking status: Former Smoker    Types: Cigarettes    Quit date: 05/14/1981  . Smokeless tobacco: Not on file  . Alcohol use No  . Drug use: No  . Sexual activity: Not on file   Other Topics Concern  . Not on file   Social History Narrative  . No narrative on file    FAMILY HISTORY: Family History  Problem Relation Age of Onset  . Heart failure Mother   . Hypertension Mother   . Kidney failure Father   . Hypertension Father   . Diabetes Brother   . Hypertension Brother     ALLERGIES:  is allergic to lisinopril.  MEDICATIONS:  Current Outpatient Prescriptions  Medication Sig Dispense Refill  . allopurinol (ZYLOPRIM) 100 MG tablet Take by mouth.    . folic acid (FOLVITE)  1 MG tablet Take by mouth.    . hydrALAZINE (APRESOLINE) 50 MG tablet     . isosorbide dinitrate (ISORDIL) 20 MG tablet Take by mouth.    . levothyroxine (SYNTHROID, LEVOTHROID) 88 MCG tablet Take by mouth.    . metoprolol succinate (TOPROL-XL) 100 MG 24 hr tablet     . pravastatin (PRAVACHOL) 40 MG tablet Take by mouth.    . sodium bicarbonate 650 MG tablet Take by mouth.    . torsemide (DEMADEX) 20 MG tablet Take by mouth.    Marland Kitchen aspirin EC 81  MG tablet Take by mouth.    . Deferiprone 500 MG TABS 5 tablets po twice a day 280 tablet 3  . loperamide (IMODIUM) 2 MG capsule Take 1 capsule (2 mg total) by mouth every 4 (four) hours as needed for diarrhea or loose stools. 90 capsule 0  . ranitidine (ZANTAC) 150 MG tablet Take by mouth.     No current facility-administered medications for this visit.     REVIEW OF SYSTEMS:    10 Point review of Systems was done is negative except as noted above.  PHYSICAL EXAMINATION: ECOG PERFORMANCE STATUS: 2 - Symptomatic, <50% confined to bed  . Vitals:   11/25/15 1400  BP: (!) 135/54  Pulse: 63  Resp: 18  Temp: 97.7 F (36.5 C)   Filed Weights   11/25/15 1400  Weight: 205 lb (93 kg)   .Body mass index is 25.62 kg/m.  GENERAL:alert, in no acute distress and comfortable SKIN: No acute rashes significant conjunctival pallor  EYES: normal, conjunctiva are pink and non-injected, sclera clear OROPHARYNX:no exudate, no erythema and lips, buccal mucosa, and tongue normal  NECK: supple, no JVD, thyroid normal size, non-tender, without nodularity LYMPH:  no palpable lymphadenopathy in the cervical, axillary or inguinal LUNGS: No rales no rhonchi HEART: regular rate & rhythm,  no murmurs and no lower extremity edema ABDOMEN: normoactive bowel sounds , spleen palpable about 3-4 cm under the left costal margin. Musculoskeletal: Trace pedal edema PSYCH: alert & oriented x 3 with fluent speech NEURO: no focal motor/sensory deficits  LABORATORY DATA:  I have reviewed the data as listed  Component     Latest Ref Rng & Units 11/28/2015  WBC     4.0 - 10.5 K/uL 3.7 (L)  RBC     4.22 - 5.81 MIL/uL 2.63 (L)  Hemoglobin     13.0 - 17.0 g/dL 7.7 (L)  HCT     39.0 - 52.0 % 24.0 (L)  MCV     78.0 - 100.0 fL 91.3  MCH     26.0 - 34.0 pg 29.3  MCHC     30.0 - 36.0 g/dL 32.1  RDW     11.5 - 15.5 % 26.0 (H)  Platelets     150 - 400 K/uL 78 (L)  Neutrophils     % 85  Lymphocytes     %  14  Monocytes Relative     % 1  Eosinophil     % 0  Basophil     % 0  NEUT#     1.7 - 7.7 K/uL 3.2  Lymphocyte #     0.7 - 4.0 K/uL 0.5 (L)  Monocyte #     0.1 - 1.0 K/uL 0.0 (L)  Eosinophils Absolute     0.0 - 0.7 K/uL 0.0  Basophils Absolute     0.0 - 0.1 K/uL 0.0  RBC Morphology  POLYCHROMASIA PRESENT  WBC Morphology      WHITE COUNT CONFIRMED ON SMEAR  Smear Review      PLATELET COUNT CONFIRMED BY SMEAR  Sodium     135 - 145 mmol/L 139  Potassium     3.5 - 5.1 mmol/L 4.2  Chloride     101 - 111 mmol/L 118 (H)  CO2     22 - 32 mmol/L 18 (L)  Glucose     65 - 99 mg/dL 123 (H)  BUN     6 - 20 mg/dL 66 (H)  Creatinine     0.61 - 1.24 mg/dL 2.85 (H)  Calcium     8.9 - 10.3 mg/dL 7.6 (L)  Total Protein     6.5 - 8.1 g/dL 5.5 (L)  Albumin     3.5 - 5.0 g/dL 2.9 (L)  AST     15 - 41 U/L 18  ALT     17 - 63 U/L 25  Alkaline Phosphatase     38 - 126 U/L 73  Total Bilirubin     0.3 - 1.2 mg/dL 0.4  EGFR (Non-African Amer.)     >60 mL/min 20 (L)  EGFR (African American)     >60 mL/min 24 (L)  Anion gap     5 - 15 3 (L)  LDH     98 - 192 U/L 1,205 (H)  Ferritin     24 - 336 ng/mL 2,687 (H)  Erythropoietin     2.6 - 18.5 mIU/mL 35.4 (H)    RADIOGRAPHIC STUDIES: I have personally reviewed the radiological images as listed and agreed with the findings in the report. No results found.  ASSESSMENT & PLAN:   75 year old very pleasant African-American male with multiple medical comorbidities with  #1 Jak2 positive primary myelofibrosis for 10 years - requiring PRBC transfusions for about the last 9 years.  Currently transfusion-dependent needing one to 2 units of PRBCs every month. Was previously on Jakafi but has been off of this since November 2016 due to intolerance and additional cytopenias.  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 -Splenomegaly due to primary  myelofibrosis -Thrombocytopenia due to hypersplenism Plan -Will get labs today to clinic CBC, CMP, ferritin, type and screen. -Patient had his last transfusion with 2 units of PRBCs on 11/12/2015 at [facility] -He would likely not tolerate anemia as well as most others due to his significant CHF and other medical comorbidities. -Erythropoietin level is low might consider erythropoietin given a part of his anemia could be from has significant chronic kidney disease. After weighing the risk and benefits of this since the patient also has A. fib and CHF. -Continue goals of care discussion -Ultrasound of the abdomen to evaluate spleen size might be indeterminate and potentially considering the use of Jak2 inhibitors. - continue labs every 4 weeks and transfuse when necessary as needed for some dramatic anemia or hemoglobin less than 8  #2 Transfusional iron overload  --on Deferiprone 258m po BID.  --goal is ferritin of <1000 --continue to monitor liver functions and blood counts.  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 Plan continue follow-up with primary care physician and cardiology   Return to clinic in 4 weeks with repeat labs with Dr PWhitney Muse  All of the patients and his wife's  questions were answered to their apparent satisfaction. The patient knows to call the clinic with any problems, questions or concerns.  I spent 60 minutes counseling the patient face to face. The total time spent in the appointment was 60 minutes and more than 50% was on counseling and direct patient cares.    Sullivan Lone MD [place] AAHIVMS [facility] Hematology/Oncology Physician [facility]  (Office):       [phone] (Work cell):  [phone] (Fax):           [phone]

## 2016-01-19 ENCOUNTER — Other Ambulatory Visit (HOSPITAL_COMMUNITY): Payer: Self-pay

## 2016-01-19 DIAGNOSIS — D469 Myelodysplastic syndrome, unspecified: Secondary | ICD-10-CM

## 2016-01-19 MED ORDER — DEFERIPRONE 500 MG PO TABS: ORAL_TABLET | ORAL | 3 refills | Status: DC

## 2016-01-20 ENCOUNTER — Encounter (HOSPITAL_COMMUNITY): Payer: Medicare HMO | Attending: Hematology & Oncology | Admitting: Hematology & Oncology

## 2016-01-20 ENCOUNTER — Encounter (HOSPITAL_COMMUNITY): Payer: Medicare HMO

## 2016-01-20 VITALS — BP 155/73 | HR 57 | Temp 97.5°F | Resp 18 | Wt 204.2 lb

## 2016-01-20 DIAGNOSIS — K529 Noninfective gastroenteritis and colitis, unspecified: Secondary | ICD-10-CM | POA: Insufficient documentation

## 2016-01-20 DIAGNOSIS — D649 Anemia, unspecified: Secondary | ICD-10-CM | POA: Diagnosis not present

## 2016-01-20 DIAGNOSIS — D471 Chronic myeloproliferative disease: Secondary | ICD-10-CM | POA: Diagnosis not present

## 2016-01-20 DIAGNOSIS — Z7982 Long term (current) use of aspirin: Secondary | ICD-10-CM | POA: Diagnosis not present

## 2016-01-20 DIAGNOSIS — Z87891 Personal history of nicotine dependence: Secondary | ICD-10-CM | POA: Diagnosis not present

## 2016-01-20 DIAGNOSIS — I130 Hypertensive heart and chronic kidney disease with heart failure and stage 1 through stage 4 chronic kidney disease, or unspecified chronic kidney disease: Secondary | ICD-10-CM | POA: Insufficient documentation

## 2016-01-20 DIAGNOSIS — Z79899 Other long term (current) drug therapy: Secondary | ICD-10-CM | POA: Insufficient documentation

## 2016-01-20 DIAGNOSIS — R197 Diarrhea, unspecified: Secondary | ICD-10-CM

## 2016-01-20 DIAGNOSIS — Z1589 Genetic susceptibility to other disease: Secondary | ICD-10-CM

## 2016-01-20 DIAGNOSIS — D474 Osteomyelofibrosis: Secondary | ICD-10-CM | POA: Insufficient documentation

## 2016-01-20 DIAGNOSIS — E1122 Type 2 diabetes mellitus with diabetic chronic kidney disease: Secondary | ICD-10-CM | POA: Diagnosis not present

## 2016-01-20 DIAGNOSIS — D469 Myelodysplastic syndrome, unspecified: Secondary | ICD-10-CM

## 2016-01-20 DIAGNOSIS — N184 Chronic kidney disease, stage 4 (severe): Secondary | ICD-10-CM | POA: Diagnosis not present

## 2016-01-20 DIAGNOSIS — I5042 Chronic combined systolic (congestive) and diastolic (congestive) heart failure: Secondary | ICD-10-CM | POA: Insufficient documentation

## 2016-01-20 LAB — CBC WITH DIFFERENTIAL/PLATELET
BASOS PCT: 0 %
Basophils Absolute: 0 10*3/uL (ref 0.0–0.1)
EOS ABS: 0 10*3/uL (ref 0.0–0.7)
EOS PCT: 1 %
HCT: 24.1 % — ABNORMAL LOW (ref 39.0–52.0)
HEMOGLOBIN: 7.8 g/dL — AB (ref 13.0–17.0)
LYMPHS ABS: 0.7 10*3/uL (ref 0.7–4.0)
Lymphocytes Relative: 16 %
MCH: 29 pg (ref 26.0–34.0)
MCHC: 32.4 g/dL (ref 30.0–36.0)
MCV: 89.6 fL (ref 78.0–100.0)
Monocytes Absolute: 0 10*3/uL — ABNORMAL LOW (ref 0.1–1.0)
Monocytes Relative: 1 %
Neutro Abs: 3.6 10*3/uL (ref 1.7–7.7)
Neutrophils Relative %: 83 %
PLATELETS: 92 10*3/uL — AB (ref 150–400)
RBC: 2.69 MIL/uL — AB (ref 4.22–5.81)
RDW: 25.1 % — ABNORMAL HIGH (ref 11.5–15.5)
WBC: 4.4 10*3/uL (ref 4.0–10.5)

## 2016-01-20 LAB — COMPREHENSIVE METABOLIC PANEL
ALT: 18 U/L (ref 17–63)
AST: 18 U/L (ref 15–41)
Albumin: 3.1 g/dL — ABNORMAL LOW (ref 3.5–5.0)
Alkaline Phosphatase: 70 U/L (ref 38–126)
Anion gap: 2 — ABNORMAL LOW (ref 5–15)
BUN: 64 mg/dL — AB (ref 6–20)
CHLORIDE: 112 mmol/L — AB (ref 101–111)
CO2: 22 mmol/L (ref 22–32)
Calcium: 7.6 mg/dL — ABNORMAL LOW (ref 8.9–10.3)
Creatinine, Ser: 3.14 mg/dL — ABNORMAL HIGH (ref 0.61–1.24)
GFR, EST AFRICAN AMERICAN: 21 mL/min — AB (ref >60)
GFR, EST NON AFRICAN AMERICAN: 18 mL/min — AB (ref >60)
Glucose, Bld: 140 mg/dL — ABNORMAL HIGH (ref 65–99)
POTASSIUM: 3.1 mmol/L — AB (ref 3.5–5.1)
SODIUM: 136 mmol/L (ref 135–145)
Total Bilirubin: 0.6 mg/dL (ref 0.3–1.2)
Total Protein: 5.9 g/dL — ABNORMAL LOW (ref 6.5–8.1)

## 2016-01-20 LAB — FERRITIN: FERRITIN: 2686 ng/mL — AB (ref 24–336)

## 2016-01-20 NOTE — Progress Notes (Addendum)
[place]  PROGRESS NOTE  Nicolas Gibson OB: 06-15-1940  MR#: [id number]  XLK#:[id number]  Patient Care Team: [name], MD as PCP - General (Internal Medicine)  VISIT DIAGNOSIS:   No diagnosis found.     Primary myelofibrosis ([place])   11/25/2015 Initial Diagnosis    Primary myelofibrosis ([place])      12/08/2015 Imaging    Abdominal Ultrasound shows interval increase in the volume of the spleen which now measures 1806 cc. 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 Increased renal cortical echotexture bilaterally. Cystic structures in the right kidney with an upper pole complex appearing structure not well characterized. Normal appearance of the gallbladder and liver and pancreas. Trace of ascites adjacent to the liver.        No flowsheet data found.  INTERVAL HISTORY:  Nicolas Gibson is a wonderful 75 y.o. male who has been referred to [place] by Dr Becky Sax MD for evaluation and management of Primary myelofibrosis. Patient has a history of Jak2 positive primary myelofibrosis diagnosed about 10 years ago and previously treated with Jakafi until about November 2016 when it was discontinued due to toxicity. He has a history of transfusion iron overload and is requiring 1-2 units of PRBCs monthly and is currently transfusion dependent. He is on deferiprone for his transfusion iron overload (with concurrent chronic kidney disease). He was previously being treated by Dr Janae Sauce and was last seen my him on 09/28/2015 .  he has also had chronic thrombocytopenia with the last platelet count being 82k in May 2017 . 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  Patient follows at [facility] for his cardiology cares for combined systolic and diastolic CHF and received his last PRBC transfusion of 2 units of PRBCs at [facility] on 11/12/2015.  Patient notes that he is doing okay today and does not have any overt distant exertion shortness of breath or  chest pain or dizziness. He was seen by nephrology at [facility] on 8/29.  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  Nicolas Gibson is accompanied by his wife. I personally reviewed and went over laboratory studies with the patient.   His stomach has been feeling "messed up". He isn't sure if it is a virus. This has been going on for the last 3 months. He reports a little vomiting, as well as diarrhea. Before he was given "that pill", he would have 3 to 4 bowel movements daily. With the pill, he goes about once a day. If he stops the pill his bowel movements increase again. Referring to his stomach, "It feels like there's water in it". He has been referred to meet with a GI physician by his PCP.  If his stomach didn't bother him, he would feel pretty good. He does not feel that weak. He feels pretty good, other than his stomach.   He reports his legs are sore from swelling. He reports certain foods do not taste the same.   Patient states Dr. Jacquiline Doe started him on a shot a year ago to help him make blood. "It didn't do no good". His wife notes he'd go every two weeks for the shot and continued to need blood transfusions.  He normally received transfusions once monthly. He normally receives 2 units. He can tell when his blood counts are low. Dr. Jacquiline Doe would check his blood monthly and have a follow up appointment every 3 months.    REVIEW OF SYSTEMS:   Gen.  status: Negative. HEENT: Denies any headache dizziness. Cardiac: Patient does have a pacemaker has a history of congestive heart failure GI: Positive for diarrhea and vomiting. Severe diarrhea managed with medication Neurological : Negative Skin: No rash 14 point review of systems was performed and is negative except as detailed under history of present illness and above   As per HPI. Otherwise, a complete review of systems is negatve.  PAST MEDICAL HISTORY: Past Medical History:  Diagnosis Date  . Anemia    He said teh  cause has not been determined. His last transfusion requirement was about 4 years ago.  Marland Kitchen History of gout   . Hypertension   . Paroxysmal atrial fibrillation (HCC)   History of Jak2 positive primary myelofibrosis for 10 years - requiring PRBC transfusions for about the last 9 years. Currently transfusion-dependent needing one to 2 units of PRBCs every month. 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  PAST SURGICAL HISTORY: Past Surgical History:  Procedure Laterality Date  . Right inguinal hernia repair      FAMILY HISTORY Family History  Problem Relation Age of Onset  . Heart failure Mother   . Hypertension Mother   . Kidney failure Father   . Hypertension Father   . Diabetes Brother   . Hypertension Brother      HEALTH MAINTENANCE: Social History  Substance Use Topics  . Smoking status: Former Smoker    Types: Cigarettes    Quit date: 05/14/1981  . Smokeless tobacco: Not on file  . Alcohol use No     Allergies  Allergen Reactions  . Lisinopril Other (See Comments)    hyperkalemia hyperkalemia    Current Outpatient Prescriptions  Medication Sig Dispense Refill  . allopurinol (ZYLOPRIM) 100 MG tablet Take by mouth.    .Marland Kitchenaspirin EC 81 MG tablet Take by mouth.    . Deferiprone 500 MG TABS 5 tablets po twice a day 3580tablet 3  . folic acid (FOLVITE) 1 MG tablet Take by mouth.    . hydrALAZINE (APRESOLINE) 50 MG tablet     . isosorbide dinitrate (ISORDIL) 20 MG tablet Take by mouth.    . levothyroxine (SYNTHROID, LEVOTHROID) 88 MCG tablet Take by mouth.    . loperamide (IMODIUM) 2 MG capsule Take 1 capsule (2 mg total) by mouth every 4 (four)  hours as needed for diarrhea or loose stools. 90 capsule 0  . metoprolol succinate (TOPROL-XL) 100 MG 24 hr tablet     . pravastatin (PRAVACHOL) 40 MG tablet Take by mouth.    . ranitidine (ZANTAC) 150 MG tablet Take by mouth.    . sodium bicarbonate 650 MG tablet Take by mouth.    . torsemide (DEMADEX) 20 MG tablet Take by mouth.     No current facility-administered medications for this visit.     OBJECTIVE: BP (!) 155/73 (BP Location: Left Arm, Patient Position: Sitting)   Pulse (!) 57   Temp 97.5 F (36.4 C) (Oral)   Resp 18   Wt 204 lb 3.2 oz (92.6 kg)   SpO2 98%   BMI 25.52 kg/m   PHYSICAL EXAM: Patient is alert oriented not any acute distress Lymphatic system: Supraclavicular, cervical, axillary, inguinal lymph nodes are not palpable HEENT: Pale looking sclerae,  no scleral icterus, oropharnx clear Cardiac: Soft systolic murmur.  Irregular heart sound patient has pacemaker, S1/S2 audible PULM: CTAB, no wheezing or rhonchi GI: Abdomen soft liver and spleen palpable Neuro: Grossly intact. Onto exam table without assistance. CN II - XII intact.  No abnormality detected. Psych: alert and oriented X 3 with fluent speech, appropriate Musculoskeletal: No gross joint deformity, trace pedal edema Skin: No rash, ? Vitiligo on face, neck All other systems have been examined   LAB RESULTS:  Appointment on 01/20/2016  Component Date Value Ref Range Status  . WBC 01/20/2016 4.4  4.0 - 10.5 K/uL Final  . RBC 01/20/2016 2.69* 4.22 - 5.81 MIL/uL Final  . Hemoglobin 01/20/2016 7.8* 13.0 - 17.0 g/dL Final  . HCT 01/20/2016 24.1* 39.0 - 52.0 % Final  . MCV 01/20/2016 89.6  78.0 - 100.0 fL Final  . MCH 01/20/2016 29.0  26.0 - 34.0 pg Final  . MCHC 01/20/2016 32.4  30.0 - 36.0 g/dL Final  . RDW 01/20/2016 25.1* 11.5 - 15.5 % Final  . Platelets 01/20/2016 92* 150 - 400 K/uL Final   Comment: SPECIMEN CHECKED FOR CLOTS PLATELET COUNT CONFIRMED BY SMEAR LARGE PLATELETS PRESENT   .  Neutrophils Relative % 01/20/2016 83  % Final  . Neutro Abs 01/20/2016 3.6  1.7 - 7.7 K/uL Final  . Lymphocytes Relative 01/20/2016 16  % Final  . Lymphs Abs 01/20/2016 0.7  0.7 - 4.0 K/uL Final  . Monocytes Relative 01/20/2016 1  % Final  . Monocytes Absolute 01/20/2016 0.0* 0.1 - 1.0 K/uL Final  . Eosinophils Relative 01/20/2016 1  % Final  . Eosinophils Absolute 01/20/2016 0.0  0.0 - 0.7 K/uL Final  . Basophils Relative 01/20/2016 0  % Final  . Basophils Absolute 01/20/2016 0.0  0.0 - 0.1 K/uL Final  . Sodium 01/20/2016 136  135 - 145 mmol/L Final  . Potassium 01/20/2016 3.1* 3.5 - 5.1 mmol/L Final  . Chloride 01/20/2016 112* 101 - 111 mmol/L Final  . CO2 01/20/2016 22  22 - 32 mmol/L Final  . Glucose, Bld 01/20/2016 140* 65 - 99 mg/dL Final  . BUN 01/20/2016 64* 6 - 20 mg/dL Final  . Creatinine, Ser 01/20/2016 3.14* 0.61 - 1.24 mg/dL Final  . Calcium 01/20/2016 7.6* 8.9 - 10.3 mg/dL Final  . Total Protein 01/20/2016 5.9* 6.5 - 8.1 g/dL Final  . Albumin 01/20/2016 3.1* 3.5 - 5.0 g/dL Final  . AST 01/20/2016 18  15 - 41 U/L Final  . ALT 01/20/2016 18  17 - 63 U/L Final  . Alkaline Phosphatase 01/20/2016 70  38 - 126 U/L Final  . Total Bilirubin 01/20/2016 0.6  0.3 - 1.2 mg/dL Final  . GFR calc non Af Amer 01/20/2016 18* >60 mL/min Final  . GFR calc Af Amer 01/20/2016 21* >60 mL/min Final   Comment: (NOTE) The eGFR has been calculated using the CKD EPI equation. This calculation has not been validated in all clinical situations. eGFR's persistently <60 mL/min signify possible Chronic Kidney Disease.   . Anion gap 01/20/2016 2* 5 - 15 Final     RADIOLOGY: I have reviewed the images below and agree with the reported results Study Result   CLINICAL DATA:  Iron overload, myelodysplastic syndrome for the past 5 years, coronary artery disease, CHF, hyperlipidemia, diabetes. , follow-up splenomegaly EXAM: ABDOMEN ULTRASOUND COMPLETE COMPARISON:  Abdominal ultrasound of August 16, 2014 FINDINGS: Gallbladder: No gallstones or wall thickening visualized. No sonographic Murphy sign noted by sonographer. Common  bile duct: Diameter: 6.9 mm Liver: The hepatic echotexture appears normal. A small amount of ascites lies adjacent to the liver. There is no focal hepatic mass nor ductal dilation. IVC: No abnormality visualized. Pancreas: Visualized portion unremarkable. Spleen: Further increase in size of the spleen which measures 18.4 x 19.3 x 9.7 cm with a calculated volume of 1806 cc. Right Kidney: Length: 11.7 cm. The renal cortical echotexture is increased and is equal tool that of the adjacent liver. There is a cortical cyst measuring 1 cm in diameter in the midpole. There is a irregular hypoechoic region in the upper pole measuring 2 x 1.7 x 1.1 cm. There is no hydronephrosis. Left Kidney: Length: 11.8 cm . The cortical echotexture is increased. There are vascular structures between the spleen and liver which may reflect splenorenal varices. Abdominal aorta: Limited visualization of the aorta. There is mural plaque. Other findings: None. IMPRESSION: 1. Interval increase in the volume of the spleen which now all measures 1806 cc. 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 2. Increased renal cortical echotexture bilaterally. Cystic structures in the right kidney with an upper pole complex appearing structure not well characterized. A dedicated renal ultrasound examination is recommended. 3. Normal appearance of the gallbladder and liver and pancreas. 4. Trace of ascites adjacent to the liver. Electronically Signed   By: David  Martinique M.D.   On: 12/08/2015 11:31    No results found.  ASSESSMENT:   Chronic myelofibrosis with Jak 2 positive disease According to the notes available patient had poor tolerance to [place] Patient is transfusion dependent and had transfusion of 4 units of packed cells [facility] last week (records are being obtained)  Iron  overload Chronic diarrhea with history C-diff CKD, stage IV   He will continue with current medications. He is to see GI next week for his diarrhea and abdominal complaints. He has been referred by his Internist.  He would like to get back on his prior schedule from [facility], I will set him up for PRBC transfusion in 2 weeks, he knows to call if he begins to feel symptomatic and we can transfuse sooner. We will then begin to check CBC's monthly. He will return for follow-up in 2 months.  He does not need any refills at this time.  He has been referred to GI by his PCP to discuss abdominal issues.   Patient expressed understanding and was in agreement with this plan. He also understands that He can call clinic at any time with any questions, concerns, or complaints.   FROM [facility]        This document serves as a record of services personally performed by Ancil Linsey, MD. It was created on her behalf by Arlyce Harman, a trained medical scribe. The creation of this record is based on the scribe's personal observations and the provider's statements to them. This document has been checked and approved by the attending provider.  I have reviewed the above documentation for accuracy and completeness and I agree with the above.  Molli Hazard, MD    01/20/2016 9:52 AM

## 2016-01-20 NOTE — Patient Instructions (Addendum)
[place] at [facility] Discharge Instructions  RECOMMENDATIONS MADE BY THE CONSULTANT AND ANY TEST RESULTS WILL BE SENT TO YOUR REFERRING PHYSICIAN.  You saw Dr. Whitney Muse today. Labs on 9/18 with transfusion on 9/19. Labs monthly starting in October. Follow up in 2 months with labs.  Thank you for choosing [place] at [facility] to provide your oncology and hematology care.  To afford each patient quality time with our provider, please arrive at least 15 minutes before your scheduled appointment time.   Beginning January 23rd 2017 lab work for the [facility] will be done in the  Main lab at [facility] on 1st floor. If you have a lab appointment with the [place] please come in thru the  Main Entrance and check in at the main information desk  You need to re-schedule your appointment should you arrive 10 or more minutes late.  We strive to give you quality time with our providers, and arriving late affects you and other patients whose appointments are after yours.  Also, if you no show three or more times for appointments you may be dismissed from the clinic at the providers discretion.     Again, thank you for choosing [facility].  Our hope is that these requests will decrease the amount of time that you wait before being seen by our physicians.       _____________________________________________________________  Should you have questions after your visit to [facility], please contact our office at [phone] between the hours of 8:30 a.m. and 4:30 p.m.  Voicemails left after 4:30 p.m. will not be returned until the following business day.  For prescription refill requests, have your pharmacy contact our office.         Resources For Cancer Patients and their Caregivers ? American Cancer Society: Can assist with transportation, wigs, general needs, runs Look Good Feel Better.         418-086-4409 ? Cancer Care: Provides financial assistance, online support groups, medication/co-pay assistance.  1-800-813-HOPE 579 471 9845) ? [place] Assists [place] cancer patients and their families through emotional , educational and financial support.  206 215 2041 ? [place] DSS Where to apply for food stamps, Medicaid and utility assistance. 506-642-0183 ? RCATS: Transportation to medical appointments. (213)800-2747 ? Social Security Administration: May apply for disability if have a Stage IV cancer. (517)268-1488 845-460-6713 ? [employer], Disability and Transit Services: Assists with nutrition, care and transit needs. [place] Support Programs: @10RELATIVEDAYS @ > Cancer Support Group  2nd Tuesday of the month 1pm-2pm, Journey Room  > Creative Journey  3rd Tuesday of the month 1130am-1pm, Journey Room  > Look Good Feel Better  1st Wednesday of the month 10am-12 noon, Journey Room (Call [place] to register [phone])

## 2016-01-21 ENCOUNTER — Encounter (HOSPITAL_COMMUNITY): Payer: Self-pay | Admitting: Hematology & Oncology

## 2016-01-23 DIAGNOSIS — D696 Thrombocytopenia, unspecified: Secondary | ICD-10-CM | POA: Insufficient documentation

## 2016-01-23 DIAGNOSIS — R188 Other ascites: Secondary | ICD-10-CM | POA: Insufficient documentation

## 2016-01-25 ENCOUNTER — Other Ambulatory Visit (HOSPITAL_COMMUNITY): Payer: Self-pay | Admitting: Oncology

## 2016-01-25 MED ORDER — DEFERIPRONE 500 MG PO TABS: ORAL_TABLET | ORAL | 3 refills | Status: DC

## 2016-01-30 ENCOUNTER — Other Ambulatory Visit (HOSPITAL_COMMUNITY): Payer: Medicare HMO

## 2016-01-30 ENCOUNTER — Encounter (HOSPITAL_COMMUNITY): Payer: Medicare HMO

## 2016-01-30 ENCOUNTER — Other Ambulatory Visit (HOSPITAL_COMMUNITY): Payer: Self-pay | Admitting: *Deleted

## 2016-01-30 DIAGNOSIS — D649 Anemia, unspecified: Secondary | ICD-10-CM | POA: Diagnosis not present

## 2016-01-30 DIAGNOSIS — D474 Osteomyelofibrosis: Secondary | ICD-10-CM

## 2016-01-30 DIAGNOSIS — D638 Anemia in other chronic diseases classified elsewhere: Secondary | ICD-10-CM

## 2016-01-30 DIAGNOSIS — D471 Chronic myeloproliferative disease: Secondary | ICD-10-CM

## 2016-01-31 ENCOUNTER — Encounter (HOSPITAL_COMMUNITY): Payer: Self-pay

## 2016-01-31 ENCOUNTER — Encounter (HOSPITAL_COMMUNITY): Payer: Medicare HMO

## 2016-01-31 ENCOUNTER — Encounter (HOSPITAL_COMMUNITY)
Admission: RE | Admit: 2016-01-31 | Discharge: 2016-01-31 | Disposition: A | Discharge status: Home / Self Care | Payer: Medicare HMO | Source: Ambulatory Visit | Attending: Hematology & Oncology | Admitting: Hematology & Oncology

## 2016-01-31 DIAGNOSIS — D471 Chronic myeloproliferative disease: Secondary | ICD-10-CM | POA: Insufficient documentation

## 2016-01-31 DIAGNOSIS — D638 Anemia in other chronic diseases classified elsewhere: Secondary | ICD-10-CM | POA: Diagnosis not present

## 2016-01-31 LAB — CBC WITH DIFFERENTIAL/PLATELET
BASOS PCT: 1 %
Basophils Absolute: 0 10*3/uL (ref 0.0–0.1)
Eosinophils Absolute: 0 10*3/uL (ref 0.0–0.7)
Eosinophils Relative: 0 %
HCT: 22.4 % — ABNORMAL LOW (ref 39.0–52.0)
Hemoglobin: 7.1 g/dL — ABNORMAL LOW (ref 13.0–17.0)
LYMPHS PCT: 15 %
Lymphs Abs: 0.6 10*3/uL — ABNORMAL LOW (ref 0.7–4.0)
MCH: 29.5 pg (ref 26.0–34.0)
MCHC: 31.7 g/dL (ref 30.0–36.0)
MCV: 92.9 fL (ref 78.0–100.0)
MONO ABS: 0.1 10*3/uL (ref 0.1–1.0)
Monocytes Relative: 3 %
NEUTROS PCT: 81 %
Neutro Abs: 3 10*3/uL (ref 1.7–7.7)
PLATELETS: 96 10*3/uL — AB (ref 150–400)
RBC: 2.41 MIL/uL — ABNORMAL LOW (ref 4.22–5.81)
RDW: 26.4 % — ABNORMAL HIGH (ref 11.5–15.5)
WBC: 3.7 10*3/uL — ABNORMAL LOW (ref 4.0–10.5)

## 2016-01-31 LAB — PREPARE RBC (CROSSMATCH)

## 2016-01-31 LAB — ABO/RH: ABO/RH(D): O POS

## 2016-01-31 MED ORDER — SODIUM CHLORIDE 0.9 % IV SOLN
250.0000 mL | Freq: Once | INTRAVENOUS | Status: AC
  Administered 2016-01-31: 250 mL via INTRAVENOUS

## 2016-01-31 MED ORDER — HEPARIN SOD (PORK) LOCK FLUSH 100 UNIT/ML IV SOLN: 500.0000 [IU] | Freq: Every day | INTRAVENOUS | Status: DC | PRN

## 2016-01-31 MED ORDER — ACETAMINOPHEN 325 MG PO TABS
650.0000 mg | ORAL_TABLET | Freq: Once | ORAL | Status: AC
  Administered 2016-01-31: 650 mg via ORAL
  Filled 2016-01-31: qty 2

## 2016-01-31 MED ORDER — SODIUM CHLORIDE 0.9% FLUSH: 10.0000 mL | INTRAVENOUS | Status: DC | PRN

## 2016-01-31 MED ORDER — DIPHENHYDRAMINE HCL 25 MG PO CAPS
25.0000 mg | ORAL_CAPSULE | Freq: Once | ORAL | Status: AC
  Administered 2016-01-31: 25 mg via ORAL
  Filled 2016-01-31: qty 1

## 2016-02-01 ENCOUNTER — Other Ambulatory Visit (HOSPITAL_COMMUNITY): Payer: Medicare HMO

## 2016-02-01 LAB — TYPE AND SCREEN
ABO/RH(D): O POS
ANTIBODY SCREEN: NEGATIVE
UNIT DIVISION: 0
Unit division: 0

## 2016-02-06 ENCOUNTER — Other Ambulatory Visit (HOSPITAL_COMMUNITY): Payer: Medicare HMO

## 2016-02-07 ENCOUNTER — Encounter (HOSPITAL_COMMUNITY): Payer: Medicare HMO

## 2016-02-24 ENCOUNTER — Other Ambulatory Visit (HOSPITAL_COMMUNITY): Payer: Self-pay | Admitting: *Deleted

## 2016-02-24 DIAGNOSIS — D638 Anemia in other chronic diseases classified elsewhere: Secondary | ICD-10-CM

## 2016-02-24 DIAGNOSIS — D6959 Other secondary thrombocytopenia: Secondary | ICD-10-CM

## 2016-02-28 ENCOUNTER — Encounter (HOSPITAL_COMMUNITY): Payer: Medicare HMO | Attending: Hematology & Oncology

## 2016-02-28 DIAGNOSIS — E876 Hypokalemia: Secondary | ICD-10-CM | POA: Insufficient documentation

## 2016-02-28 DIAGNOSIS — D474 Osteomyelofibrosis: Secondary | ICD-10-CM | POA: Insufficient documentation

## 2016-02-28 DIAGNOSIS — D6959 Other secondary thrombocytopenia: Secondary | ICD-10-CM

## 2016-02-28 DIAGNOSIS — D649 Anemia, unspecified: Secondary | ICD-10-CM

## 2016-02-28 DIAGNOSIS — D469 Myelodysplastic syndrome, unspecified: Secondary | ICD-10-CM | POA: Insufficient documentation

## 2016-02-28 DIAGNOSIS — D638 Anemia in other chronic diseases classified elsewhere: Secondary | ICD-10-CM

## 2016-02-28 LAB — COMPREHENSIVE METABOLIC PANEL
ALBUMIN: 2.7 g/dL — AB (ref 3.5–5.0)
ALT: 22 U/L (ref 17–63)
ANION GAP: 9 (ref 5–15)
AST: 30 U/L (ref 15–41)
Alkaline Phosphatase: 84 U/L (ref 38–126)
BILIRUBIN TOTAL: 0.6 mg/dL (ref 0.3–1.2)
BUN: 58 mg/dL — ABNORMAL HIGH (ref 6–20)
CHLORIDE: 112 mmol/L — AB (ref 101–111)
CO2: 21 mmol/L — AB (ref 22–32)
Calcium: 7.8 mg/dL — ABNORMAL LOW (ref 8.9–10.3)
Creatinine, Ser: 3.03 mg/dL — ABNORMAL HIGH (ref 0.61–1.24)
GFR calc Af Amer: 22 mL/min — ABNORMAL LOW (ref >60)
GFR calc non Af Amer: 19 mL/min — ABNORMAL LOW (ref >60)
GLUCOSE: 131 mg/dL — AB (ref 65–99)
POTASSIUM: 3.1 mmol/L — AB (ref 3.5–5.1)
SODIUM: 142 mmol/L (ref 135–145)
TOTAL PROTEIN: 5.5 g/dL — AB (ref 6.5–8.1)

## 2016-02-28 LAB — CBC WITH DIFFERENTIAL/PLATELET
BASOS ABS: 0 10*3/uL (ref 0.0–0.1)
BASOS PCT: 0 %
EOS ABS: 0 10*3/uL (ref 0.0–0.7)
EOS PCT: 1 %
HCT: 22 % — ABNORMAL LOW (ref 39.0–52.0)
Hemoglobin: 7.1 g/dL — ABNORMAL LOW (ref 13.0–17.0)
Lymphocytes Relative: 11 %
Lymphs Abs: 0.5 10*3/uL — ABNORMAL LOW (ref 0.7–4.0)
MCH: 30.7 pg (ref 26.0–34.0)
MCHC: 32.3 g/dL (ref 30.0–36.0)
MCV: 95.2 fL (ref 78.0–100.0)
MONO ABS: 0.1 10*3/uL (ref 0.1–1.0)
MONOS PCT: 2 %
Neutro Abs: 4.2 10*3/uL (ref 1.7–7.7)
Neutrophils Relative %: 86 %
PLATELETS: 101 10*3/uL — AB (ref 150–400)
RBC: 2.31 MIL/uL — ABNORMAL LOW (ref 4.22–5.81)
RDW: 28.7 % — AB (ref 11.5–15.5)
WBC: 4.9 10*3/uL (ref 4.0–10.5)

## 2016-02-28 LAB — FERRITIN: Ferritin: 2894 ng/mL — ABNORMAL HIGH (ref 24–336)

## 2016-02-28 LAB — SAMPLE TO BLOOD BANK

## 2016-02-28 LAB — PREPARE RBC (CROSSMATCH)

## 2016-02-29 ENCOUNTER — Encounter (HOSPITAL_BASED_OUTPATIENT_CLINIC_OR_DEPARTMENT_OTHER): Payer: Medicare HMO

## 2016-02-29 VITALS — BP 160/78 | HR 50 | Temp 97.3°F | Resp 16

## 2016-02-29 DIAGNOSIS — D469 Myelodysplastic syndrome, unspecified: Secondary | ICD-10-CM

## 2016-02-29 DIAGNOSIS — D474 Osteomyelofibrosis: Secondary | ICD-10-CM

## 2016-02-29 DIAGNOSIS — R197 Diarrhea, unspecified: Secondary | ICD-10-CM

## 2016-02-29 DIAGNOSIS — D649 Anemia, unspecified: Secondary | ICD-10-CM

## 2016-02-29 DIAGNOSIS — I272 Pulmonary hypertension, unspecified: Secondary | ICD-10-CM

## 2016-02-29 DIAGNOSIS — E876 Hypokalemia: Secondary | ICD-10-CM

## 2016-02-29 MED ORDER — ACETAMINOPHEN 325 MG PO TABS
ORAL_TABLET | ORAL | Status: AC
  Filled 2016-02-29: qty 2

## 2016-02-29 MED ORDER — SODIUM CHLORIDE 0.9 % IV SOLN
250.0000 mL | Freq: Once | INTRAVENOUS | Status: AC
  Administered 2016-02-29: 250 mL via INTRAVENOUS

## 2016-02-29 MED ORDER — LOPERAMIDE HCL 2 MG PO TABS
2.0000 mg | ORAL_TABLET | ORAL | Status: AC
  Administered 2016-02-29: 2 mg via ORAL
  Filled 2016-02-29 (×2): qty 1

## 2016-02-29 MED ORDER — POTASSIUM CHLORIDE CRYS ER 20 MEQ PO TBCR: 20.0000 meq | EXTENDED_RELEASE_TABLET | Freq: Every day | ORAL | 2 refills | Status: DC

## 2016-02-29 MED ORDER — ACETAMINOPHEN 325 MG PO TABS
650.0000 mg | ORAL_TABLET | Freq: Once | ORAL | Status: AC
  Administered 2016-02-29: 650 mg via ORAL

## 2016-02-29 MED ORDER — FUROSEMIDE 10 MG/ML IJ SOLN
40.0000 mg | Freq: Once | INTRAMUSCULAR | Status: AC
  Administered 2016-02-29: 40 mg via INTRAVENOUS
  Filled 2016-02-29: qty 4

## 2016-02-29 MED ORDER — DIPHENHYDRAMINE HCL 25 MG PO CAPS
ORAL_CAPSULE | ORAL | Status: AC
  Filled 2016-02-29: qty 1

## 2016-02-29 MED ORDER — SODIUM CHLORIDE 0.9% FLUSH
10.0000 mL | INTRAVENOUS | Status: AC | PRN
  Administered 2016-02-29: 10 mL

## 2016-02-29 MED ORDER — DIPHENHYDRAMINE HCL 25 MG PO CAPS
25.0000 mg | ORAL_CAPSULE | Freq: Once | ORAL | Status: AC
  Administered 2016-02-29: 25 mg via ORAL

## 2016-02-29 NOTE — Patient Instructions (Signed)
[place] at [facility] Discharge Instructions  RECOMMENDATIONS MADE BY THE CONSULTANT AND ANY TEST RESULTS WILL BE SENT TO YOUR REFERRING PHYSICIAN.  Today you received a blood transfusion of 2 units packed red blood cells. You received Lasix 40 mg IV post transfusion. Return as scheduled.  Thank you for choosing [place] at [facility] to provide your oncology and hematology care.  To afford each patient quality time with our provider, please arrive at least 15 minutes before your scheduled appointment time.   Beginning January 23rd 2017 lab work for the [facility] will be done in the  Main lab at [facility] on 1st floor. If you have a lab appointment with the [place] please come in thru the  Main Entrance and check in at the main information desk  You need to re-schedule your appointment should you arrive 10 or more minutes late.  We strive to give you quality time with our providers, and arriving late affects you and other patients whose appointments are after yours.  Also, if you no show three or more times for appointments you may be dismissed from the clinic at the providers discretion.     Again, thank you for choosing [facility].  Our hope is that these requests will decrease the amount of time that you wait before being seen by our physicians.       _____________________________________________________________  Should you have questions after your visit to [facility], please contact our office at [phone] between the hours of 8:30 a.m. and 4:30 p.m.  Voicemails left after 4:30 p.m. will not be returned until the following business day.  For prescription refill requests, have your pharmacy contact our office.         Resources For Cancer Patients and their Caregivers ? American Cancer Society: Can assist with transportation, wigs, general needs, runs Look Good Feel Better.         718-470-1585 ? Cancer Care: Provides financial assistance, online support groups, medication/co-pay assistance.  1-800-813-HOPE (787)853-8054) ? [place] Assists [place] cancer patients and their families through emotional , educational and financial support.  (571)674-1885 ? [place] DSS Where to apply for food stamps, Medicaid and utility assistance. 252-018-6450 ? RCATS: Transportation to medical appointments. 863 811 1515 ? Social Security Administration: May apply for disability if have a Stage IV cancer. 726-024-4612 4166123888 ? [employer], Disability and Transit Services: Assists with nutrition, care and transit needs. [place] Support Programs: @10RELATIVEDAYS @ > Cancer Support Group  2nd Tuesday of the month 1pm-2pm, Journey Room  > Creative Journey  3rd Tuesday of the month 1130am-1pm, Journey Room  > Look Good Feel Better  1st Wednesday of the month 10am-12 noon, Journey Room (Call [place] to register [phone])  to

## 2016-02-29 NOTE — Progress Notes (Signed)
Tolerated blood transfusion well. Stable and ambulatory on discharge home with wife. 

## 2016-03-01 LAB — TYPE AND SCREEN
ABO/RH(D): O POS
ANTIBODY SCREEN: NEGATIVE
Unit division: 0
Unit division: 0

## 2016-03-23 ENCOUNTER — Other Ambulatory Visit (HOSPITAL_COMMUNITY): Payer: Medicare HMO

## 2016-03-23 ENCOUNTER — Ambulatory Visit (HOSPITAL_COMMUNITY): Payer: Medicare HMO | Admitting: Hematology & Oncology

## 2016-03-24 NOTE — Progress Notes (Signed)
This encounter was created in error - please disregard.

## 2016-03-29 ENCOUNTER — Other Ambulatory Visit (HOSPITAL_COMMUNITY): Payer: Self-pay | Admitting: Oncology

## 2016-03-29 DIAGNOSIS — D474 Osteomyelofibrosis: Secondary | ICD-10-CM

## 2016-04-02 ENCOUNTER — Other Ambulatory Visit (HOSPITAL_COMMUNITY): Payer: Self-pay

## 2016-04-02 ENCOUNTER — Encounter (HOSPITAL_COMMUNITY): Payer: Self-pay

## 2016-04-02 ENCOUNTER — Encounter (HOSPITAL_COMMUNITY): Payer: Medicare HMO | Attending: Hematology & Oncology

## 2016-04-02 DIAGNOSIS — E876 Hypokalemia: Secondary | ICD-10-CM | POA: Diagnosis not present

## 2016-04-02 DIAGNOSIS — D469 Myelodysplastic syndrome, unspecified: Secondary | ICD-10-CM | POA: Insufficient documentation

## 2016-04-02 DIAGNOSIS — R197 Diarrhea, unspecified: Secondary | ICD-10-CM

## 2016-04-02 DIAGNOSIS — D649 Anemia, unspecified: Secondary | ICD-10-CM

## 2016-04-02 DIAGNOSIS — D474 Osteomyelofibrosis: Secondary | ICD-10-CM | POA: Diagnosis not present

## 2016-04-02 DIAGNOSIS — D508 Other iron deficiency anemias: Secondary | ICD-10-CM

## 2016-04-02 LAB — CBC WITH DIFFERENTIAL/PLATELET
Basophils Absolute: 0 10*3/uL (ref 0.0–0.1)
Basophils Relative: 0 %
EOS ABS: 0 10*3/uL (ref 0.0–0.7)
EOS PCT: 0 %
HCT: 22.4 % — ABNORMAL LOW (ref 39.0–52.0)
Hemoglobin: 7.2 g/dL — ABNORMAL LOW (ref 13.0–17.0)
LYMPHS ABS: 0.7 10*3/uL (ref 0.7–4.0)
Lymphocytes Relative: 16 %
MCH: 31.3 pg (ref 26.0–34.0)
MCHC: 32.1 g/dL (ref 30.0–36.0)
MCV: 97.4 fL (ref 78.0–100.0)
MONO ABS: 0 10*3/uL — AB (ref 0.1–1.0)
MONOS PCT: 0 %
Neutro Abs: 3.8 10*3/uL (ref 1.7–7.7)
Neutrophils Relative %: 83 %
PLATELETS: 104 10*3/uL — AB (ref 150–400)
RBC: 2.3 MIL/uL — ABNORMAL LOW (ref 4.22–5.81)
RDW: 27.3 % — AB (ref 11.5–15.5)
WBC: 4.6 10*3/uL (ref 4.0–10.5)

## 2016-04-02 LAB — COMPREHENSIVE METABOLIC PANEL
ALBUMIN: 3 g/dL — AB (ref 3.5–5.0)
ALK PHOS: 92 U/L (ref 38–126)
ALT: 22 U/L (ref 17–63)
ANION GAP: 8 (ref 5–15)
AST: 24 U/L (ref 15–41)
BUN: 79 mg/dL — ABNORMAL HIGH (ref 6–20)
CALCIUM: 7.8 mg/dL — AB (ref 8.9–10.3)
CO2: 18 mmol/L — AB (ref 22–32)
CREATININE: 3.94 mg/dL — AB (ref 0.61–1.24)
Chloride: 115 mmol/L — ABNORMAL HIGH (ref 101–111)
GFR calc Af Amer: 16 mL/min — ABNORMAL LOW (ref >60)
GFR calc non Af Amer: 14 mL/min — ABNORMAL LOW (ref >60)
Glucose, Bld: 82 mg/dL (ref 65–99)
Potassium: 4.8 mmol/L (ref 3.5–5.1)
SODIUM: 141 mmol/L (ref 135–145)
Total Bilirubin: 0.6 mg/dL (ref 0.3–1.2)
Total Protein: 6 g/dL — ABNORMAL LOW (ref 6.5–8.1)

## 2016-04-02 LAB — FERRITIN: Ferritin: 2734 ng/mL — ABNORMAL HIGH (ref 24–336)

## 2016-04-02 LAB — PREPARE RBC (CROSSMATCH)

## 2016-04-02 MED ORDER — LOPERAMIDE HCL 2 MG PO CAPS: 2.0000 mg | ORAL_CAPSULE | ORAL | 0 refills | Status: AC | PRN

## 2016-04-02 NOTE — Telephone Encounter (Signed)
Received refill request from patients pharmacy for loperamide. Chart checked and refilled.

## 2016-04-03 ENCOUNTER — Encounter (HOSPITAL_BASED_OUTPATIENT_CLINIC_OR_DEPARTMENT_OTHER): Payer: Medicare HMO

## 2016-04-03 DIAGNOSIS — D508 Other iron deficiency anemias: Secondary | ICD-10-CM

## 2016-04-03 DIAGNOSIS — D649 Anemia, unspecified: Secondary | ICD-10-CM | POA: Diagnosis not present

## 2016-04-03 DIAGNOSIS — D474 Osteomyelofibrosis: Secondary | ICD-10-CM | POA: Diagnosis not present

## 2016-04-03 MED ORDER — ACETAMINOPHEN 325 MG PO TABS
650.0000 mg | ORAL_TABLET | Freq: Once | ORAL | Status: AC
  Administered 2016-04-03: 650 mg via ORAL
  Filled 2016-04-03: qty 2

## 2016-04-03 MED ORDER — SODIUM CHLORIDE 0.9 % IV SOLN
250.0000 mL | Freq: Once | INTRAVENOUS | Status: AC
  Administered 2016-04-03: 250 mL via INTRAVENOUS

## 2016-04-03 MED ORDER — DIPHENHYDRAMINE HCL 25 MG PO CAPS
25.0000 mg | ORAL_CAPSULE | Freq: Once | ORAL | Status: AC
  Administered 2016-04-03: 25 mg via ORAL
  Filled 2016-04-03: qty 1

## 2016-04-03 NOTE — Patient Instructions (Signed)
[place] at [facility] Discharge Instructions  RECOMMENDATIONS MADE BY THE CONSULTANT AND ANY TEST RESULTS WILL BE SENT TO YOUR REFERRING PHYSICIAN.  Received 2 units of blood today. Follow-up as scheduled. Call clinic for any questions or concerns  Thank you for choosing [place] at [facility] to provide your oncology and hematology care.  To afford each patient quality time with our provider, please arrive at least 15 minutes before your scheduled appointment time.   Beginning January 23rd 2017 lab work for the [facility] will be done in the  Main lab at [facility] on 1st floor. If you have a lab appointment with the [place] please come in thru the  Main Entrance and check in at the main information desk  You need to re-schedule your appointment should you arrive 10 or more minutes late.  We strive to give you quality time with our providers, and arriving late affects you and other patients whose appointments are after yours.  Also, if you no show three or more times for appointments you may be dismissed from the clinic at the providers discretion.     Again, thank you for choosing [facility].  Our hope is that these requests will decrease the amount of time that you wait before being seen by our physicians.       _____________________________________________________________  Should you have questions after your visit to [facility], please contact our office at [phone] between the hours of 8:30 a.m. and 4:30 p.m.  Voicemails left after 4:30 p.m. will not be returned until the following business day.  For prescription refill requests, have your pharmacy contact our office.         Resources For Cancer Patients and their Caregivers ? American Cancer Society: Can assist with transportation, wigs, general needs, runs Look Good Feel Better.        959-403-8461 ? Cancer Care: Provides financial  assistance, online support groups, medication/co-pay assistance.  1-800-813-HOPE 331-369-2068) ? [place] Assists [place] cancer patients and their families through emotional , educational and financial support.  216-634-2467 ? [place] DSS Where to apply for food stamps, Medicaid and utility assistance. (252)336-5380 ? RCATS: Transportation to medical appointments. 928 816 4463 ? Social Security Administration: May apply for disability if have a Stage IV cancer. (929)218-1395 9121741228 ? [employer], Disability and Transit Services: Assists with nutrition, care and transit needs. [place] Support Programs: @10RELATIVEDAYS @ > Cancer Support Group  2nd Tuesday of the month 1pm-2pm, Journey Room  > Creative Journey  3rd Tuesday of the month 1130am-1pm, Journey Room  > Look Good Feel Better  1st Wednesday of the month 10am-12 noon, Journey Room (Call [place] to register [phone])

## 2016-04-03 NOTE — Progress Notes (Signed)
[place] [facility] tolerated blood transfusion with 2 units PRBC's well without complaints or incident. VSS upon discharge. Pt discharged via wheelchair in satisfactory condition accompanied by his wife

## 2016-04-04 LAB — TYPE AND SCREEN
ABO/RH(D): O POS
Antibody Screen: NEGATIVE
UNIT DIVISION: 0
UNIT DIVISION: 0

## 2016-04-13 ENCOUNTER — Encounter (HOSPITAL_COMMUNITY): Payer: Self-pay | Admitting: Oncology

## 2016-04-13 ENCOUNTER — Encounter (HOSPITAL_COMMUNITY): Payer: Medicare HMO | Attending: Hematology & Oncology | Admitting: Oncology

## 2016-04-13 ENCOUNTER — Other Ambulatory Visit (HOSPITAL_COMMUNITY): Payer: Self-pay

## 2016-04-13 VITALS — BP 128/62 | HR 59 | Temp 97.6°F | Resp 16 | Ht 75.0 in | Wt 189.8 lb

## 2016-04-13 DIAGNOSIS — E1122 Type 2 diabetes mellitus with diabetic chronic kidney disease: Secondary | ICD-10-CM | POA: Insufficient documentation

## 2016-04-13 DIAGNOSIS — N184 Chronic kidney disease, stage 4 (severe): Secondary | ICD-10-CM | POA: Insufficient documentation

## 2016-04-13 DIAGNOSIS — D471 Chronic myeloproliferative disease: Secondary | ICD-10-CM | POA: Diagnosis not present

## 2016-04-13 DIAGNOSIS — D649 Anemia, unspecified: Secondary | ICD-10-CM | POA: Insufficient documentation

## 2016-04-13 DIAGNOSIS — D474 Osteomyelofibrosis: Secondary | ICD-10-CM | POA: Insufficient documentation

## 2016-04-13 DIAGNOSIS — Z79899 Other long term (current) drug therapy: Secondary | ICD-10-CM | POA: Insufficient documentation

## 2016-04-13 DIAGNOSIS — I5042 Chronic combined systolic (congestive) and diastolic (congestive) heart failure: Secondary | ICD-10-CM | POA: Insufficient documentation

## 2016-04-13 DIAGNOSIS — Z7982 Long term (current) use of aspirin: Secondary | ICD-10-CM | POA: Insufficient documentation

## 2016-04-13 DIAGNOSIS — D469 Myelodysplastic syndrome, unspecified: Secondary | ICD-10-CM

## 2016-04-13 DIAGNOSIS — Z87891 Personal history of nicotine dependence: Secondary | ICD-10-CM | POA: Insufficient documentation

## 2016-04-13 DIAGNOSIS — I130 Hypertensive heart and chronic kidney disease with heart failure and stage 1 through stage 4 chronic kidney disease, or unspecified chronic kidney disease: Secondary | ICD-10-CM | POA: Insufficient documentation

## 2016-04-13 DIAGNOSIS — K529 Noninfective gastroenteritis and colitis, unspecified: Secondary | ICD-10-CM | POA: Insufficient documentation

## 2016-04-13 DIAGNOSIS — R197 Diarrhea, unspecified: Secondary | ICD-10-CM

## 2016-04-13 NOTE — Patient Instructions (Signed)
[place] at [facility] Discharge Instructions  RECOMMENDATIONS MADE BY THE CONSULTANT AND ANY TEST RESULTS WILL BE SENT TO YOUR REFERRING PHYSICIAN.   Labs approximately on 12/18 or 12/19 so that if you need blood we can do a transfusion  Monthly labs with blood transfusion monthly if needed  Return in 2-3 months to see [place]  Thank you for choosing [place] at [facility] to provide your oncology and hematology care.  To afford each patient quality time with our provider, please arrive at least 15 minutes before your scheduled appointment time.   Beginning January 23rd 2017 lab work for the [facility] will be done in the  Main lab at [facility] on 1st floor. If you have a lab appointment with the [place] please come in thru the  Main Entrance and check in at the main information desk  You need to re-schedule your appointment should you arrive 10 or more minutes late.  We strive to give you quality time with our providers, and arriving late affects you and other patients whose appointments are after yours.  Also, if you no show three or more times for appointments you may be dismissed from the clinic at the providers discretion.     Again, thank you for choosing [facility].  Our hope is that these requests will decrease the amount of time that you wait before being seen by our physicians.       _____________________________________________________________  Should you have questions after your visit to [facility], please contact our office at [phone] between the hours of 8:30 a.m. and 4:30 p.m.  Voicemails left after 4:30 p.m. will not be returned until the following business day.  For prescription refill requests, have your pharmacy contact our office.         Resources For Cancer Patients and their Caregivers ? American Cancer Society: Can assist with transportation, wigs, general needs, runs  Look Good Feel Better.        820-326-3795 ? Cancer Care: Provides financial assistance, online support groups, medication/co-pay assistance.  1-800-813-HOPE 737-423-6337) ? [place] Assists [place] cancer patients and their families through emotional , educational and financial support.  248-881-3808 ? [place] DSS Where to apply for food stamps, Medicaid and utility assistance. 864-313-6722 ? RCATS: Transportation to medical appointments. 971-707-8563 ? Social Security Administration: May apply for disability if have a Stage IV cancer. (908)148-4242 [phone] ? [employer], Disability and Transit Services: Assists with nutrition, care and transit needs. [place] Support Programs: @10RELATIVEDAYS @ > Cancer Support Group  2nd Tuesday of the month 1pm-2pm, Journey Room  > Creative Journey  3rd Tuesday of the month 1130am-1pm, Journey Room  > Look Good Feel Better  1st Wednesday of the month 10am-12 noon, Journey Room (Call [place] to register [phone])

## 2016-04-13 NOTE — Assessment & Plan Note (Signed)
Chronic myelofibrosis, JAK2 POSITIVE with poor tolerance (according to records) to [facility].  Transfusion dependent requiring monthly transfusion and iron overloaded secondary repeated transfusions, on Deferiprone.  Oncology history is up to date.  Labs on 04/02/2016: CBC diff, CMET, ferritin.  I personally reviewed and went over laboratory results with the patient.  The results are noted within this dictation.  Labs on 12/18 or 12/19: CBC diff, CMET, ferritin, sample to blood bank.  If his labs permit, and he desire a blood transfusion prior to Christmas, then we will accommodate accordingly.  Labs monthly: CBC diff, CMET, ferritin, sample to blood bank.  PRBCs PRN.  Continue with iron chelation as directed.  Return in 2-3 months for follow-up.

## 2016-04-13 NOTE — Progress Notes (Signed)
Nicolas Morel, MD [address] [place] [address]  Primary myelofibrosis [facility]) - Plan: Sample to Blood Bank  CURRENT THERAPY: Blood transfusion PRN and iron chelation therapy.  INTERVAL HISTORY: Nicolas Gibson 75 y.o. male returns for followup of chronic myelofibrosis, JAK2 POSITIVE with poor tolerance (according to records) to [facility].  Transfusion dependent requiring monthly transfusion and iron overloaded secondary repeated transfusions, on Deferiprone.    Primary myelofibrosis ([place])   06/27/2012 Initial Diagnosis    Primary myelofibrosis ([place])      12/08/2015 Imaging    Abdominal Ultrasound shows interval increase in the volume of the spleen which now measures 1806 cc. 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 Increased renal cortical echotexture bilaterally. Cystic structures in the right kidney with an upper pole complex appearing structure not well characterized. Normal appearance of the gallbladder and liver and pancreas. Trace of ascites adjacent to the liver.       He is doing well.  He reports that he feels "wonderful."  He received a 2 unit PRBC on 04/03/2016 fir a HGB of 7.2 g/dL.    He denies any new complaints.  He continues to have loose stools.  He has been seen by GI with a negative work-up.  I do not have those records available to review.  Review of Systems  Constitutional: Negative.  Negative for chills, fever and weight loss.  HENT: Negative.   Eyes: Negative.   Respiratory: Negative.  Negative for cough.   Cardiovascular: Negative.  Negative for chest pain.  Gastrointestinal: Positive for diarrhea (loose stools). Negative for blood in stool, constipation, melena, nausea and vomiting.  Genitourinary: Negative.   Musculoskeletal: Negative.   Skin: Negative.   Neurological: Negative.  Negative for weakness.  Endo/Heme/Allergies: Negative.   Psychiatric/Behavioral: Negative.     Past Medical History:  Diagnosis Date  . Anemia      He said teh cause has not been determined. His last transfusion requirement was about 4 years ago.  [place] History of gout   . Hypertension   . Paroxysmal atrial fibrillation (HCC)   . Primary myelofibrosis ([place]) 11/25/2015    Past Surgical History:  Procedure Laterality Date  . Right inguinal hernia repair      Family History  Problem Relation Age of Onset  . Heart failure Mother   . Hypertension Mother   . Kidney failure Father   . Hypertension Father   . Diabetes Brother   . Hypertension Brother     Social History   Social History  . Marital status: Married    Spouse name: N/A  . Number of children: 1  . Years of education: N/A   Occupational History  . Retired    Social History Main Topics  . Smoking status: Former Smoker    Types: Cigarettes    Quit date: 05/14/1981  . Smokeless tobacco: None  . Alcohol use No  . Drug use: No  . Sexual activity: Not Asked   Other Topics Concern  . None   Social History Narrative  . None     PHYSICAL EXAMINATION  ECOG PERFORMANCE STATUS: 2 - Symptomatic, <50% confined to bed  Vitals:   04/13/16 1206  BP: 128/62  Pulse: (!) 59  Resp: 16  Temp: 97.6 F (36.4 C)    GENERAL:alert, no distress, well nourished, well developed, comfortable, cooperative, smiling and accompanied by his wife. SKIN: skin color, texture, turgor are normal, no rashes or significant lesions HEAD: Normocephalic,  No masses, lesions, tenderness or abnormalities EYES: normal, EOMI, Conjunctiva are pink and non-injected EARS: External ears normal OROPHARYNX:lips, buccal mucosa, and tongue normal and mucous membranes are moist  NECK: supple, trachea midline LYMPH:  no palpable lymphadenopathy BREAST:not examined LUNGS: clear to auscultation  HEART: regular rate & rhythm ABDOMEN:abdomen soft and normal bowel sounds BACK: Back symmetric, no curvature. EXTREMITIES:less then 2 second capillary refill, no joint deformities, effusion, or inflammation,  no skin discoloration, no cyanosis  NEURO: alert & oriented x 3 with fluent speech, no focal motor/sensory deficits, in wheelchair.   LABORATORY DATA: CBC    Component Value Date/Time   WBC 4.6 04/02/2016 1141   RBC 2.30 (L) 04/02/2016 1141   HGB 7.2 (L) 04/02/2016 1141   HCT 22.4 (L) 04/02/2016 1141   PLT 104 (L) 04/02/2016 1141   MCV 97.4 04/02/2016 1141   MCH 31.3 04/02/2016 1141   MCHC 32.1 04/02/2016 1141   RDW 27.3 (H) 04/02/2016 1141   LYMPHSABS 0.7 04/02/2016 1141   MONOABS 0.0 (L) 04/02/2016 1141   EOSABS 0.0 04/02/2016 1141   BASOSABS 0.0 04/02/2016 1141      Chemistry      Component Value Date/Time   NA 141 04/02/2016 1141   K 4.8 04/02/2016 1141   CL 115 (H) 04/02/2016 1141   CO2 18 (L) 04/02/2016 1141   BUN 79 (H) 04/02/2016 1141   CREATININE 3.94 (H) 04/02/2016 1141      Component Value Date/Time   CALCIUM 7.8 (L) 04/02/2016 1141   ALKPHOS 92 04/02/2016 1141   AST 24 04/02/2016 1141   ALT 22 04/02/2016 1141   BILITOT 0.6 04/02/2016 1141     Lab Results  Component Value Date   FERRITIN 2,734 (H) 04/02/2016     PENDING LABS:   RADIOGRAPHIC STUDIES:  No results found.   PATHOLOGY:    ASSESSMENT AND PLAN:  Primary myelofibrosis ([place]) Chronic myelofibrosis, JAK2 POSITIVE with poor tolerance (according to records) to [facility].  Transfusion dependent requiring monthly transfusion and iron overloaded secondary repeated transfusions, on Deferiprone.  Oncology history is up to date.  Labs on 04/02/2016: CBC diff, CMET, ferritin.  I personally reviewed and went over laboratory results with the patient.  The results are noted within this dictation.  Labs on 12/18 or 12/19: CBC diff, CMET, ferritin, sample to blood bank.  If his labs permit, and he desire a blood transfusion prior to Christmas, then we will accommodate accordingly.  Labs monthly: CBC diff, CMET, ferritin, sample to blood bank.  PRBCs PRN.  Continue with iron chelation as  directed.  Return in 2-3 months for follow-up.   ORDERS PLACED FOR THIS ENCOUNTER: Orders Placed This Encounter  Procedures  . Sample to Blood Bank    MEDICATIONS PRESCRIBED THIS ENCOUNTER: No orders of the defined types were placed in this encounter.   THERAPY PLAN:  PRBC transfusion PRN and ongoing iron chelation therapy.  All questions were answered. The patient knows to call the clinic with any problems, questions or concerns. We can certainly see the patient much sooner if necessary.  Patient and plan discussed with Dr. Ancil Linsey and she is in agreement with the aforementioned.   This note is electronically signed by: Doy Mince 04/13/2016 1:24 PM

## 2016-04-30 ENCOUNTER — Encounter (HOSPITAL_COMMUNITY): Payer: Medicare HMO

## 2016-04-30 ENCOUNTER — Other Ambulatory Visit (HOSPITAL_COMMUNITY): Payer: Self-pay

## 2016-04-30 DIAGNOSIS — I130 Hypertensive heart and chronic kidney disease with heart failure and stage 1 through stage 4 chronic kidney disease, or unspecified chronic kidney disease: Secondary | ICD-10-CM | POA: Diagnosis not present

## 2016-04-30 DIAGNOSIS — D649 Anemia, unspecified: Secondary | ICD-10-CM | POA: Diagnosis present

## 2016-04-30 DIAGNOSIS — Z79899 Other long term (current) drug therapy: Secondary | ICD-10-CM | POA: Diagnosis not present

## 2016-04-30 DIAGNOSIS — D474 Osteomyelofibrosis: Secondary | ICD-10-CM

## 2016-04-30 DIAGNOSIS — E1122 Type 2 diabetes mellitus with diabetic chronic kidney disease: Secondary | ICD-10-CM | POA: Diagnosis not present

## 2016-04-30 DIAGNOSIS — N184 Chronic kidney disease, stage 4 (severe): Secondary | ICD-10-CM | POA: Diagnosis not present

## 2016-04-30 DIAGNOSIS — K529 Noninfective gastroenteritis and colitis, unspecified: Secondary | ICD-10-CM | POA: Diagnosis not present

## 2016-04-30 DIAGNOSIS — Z87891 Personal history of nicotine dependence: Secondary | ICD-10-CM | POA: Diagnosis not present

## 2016-04-30 DIAGNOSIS — D469 Myelodysplastic syndrome, unspecified: Secondary | ICD-10-CM

## 2016-04-30 DIAGNOSIS — Z7982 Long term (current) use of aspirin: Secondary | ICD-10-CM | POA: Diagnosis not present

## 2016-04-30 DIAGNOSIS — I5042 Chronic combined systolic (congestive) and diastolic (congestive) heart failure: Secondary | ICD-10-CM | POA: Diagnosis not present

## 2016-04-30 LAB — COMPREHENSIVE METABOLIC PANEL
ALT: 42 U/L (ref 17–63)
ANION GAP: 8 (ref 5–15)
AST: 36 U/L (ref 15–41)
Albumin: 3.2 g/dL — ABNORMAL LOW (ref 3.5–5.0)
Alkaline Phosphatase: 107 U/L (ref 38–126)
BUN: 100 mg/dL — ABNORMAL HIGH (ref 6–20)
CALCIUM: 7.3 mg/dL — AB (ref 8.9–10.3)
CHLORIDE: 113 mmol/L — AB (ref 101–111)
CO2: 16 mmol/L — AB (ref 22–32)
CREATININE: 3.69 mg/dL — AB (ref 0.61–1.24)
GFR, EST AFRICAN AMERICAN: 17 mL/min — AB (ref >60)
GFR, EST NON AFRICAN AMERICAN: 15 mL/min — AB (ref >60)
Glucose, Bld: 102 mg/dL — ABNORMAL HIGH (ref 65–99)
Potassium: 4.5 mmol/L (ref 3.5–5.1)
SODIUM: 137 mmol/L (ref 135–145)
Total Bilirubin: 0.6 mg/dL (ref 0.3–1.2)
Total Protein: 6.4 g/dL — ABNORMAL LOW (ref 6.5–8.1)

## 2016-04-30 LAB — CBC WITH DIFFERENTIAL/PLATELET
BASOS PCT: 0 %
Basophils Absolute: 0 10*3/uL (ref 0.0–0.1)
EOS ABS: 0 10*3/uL (ref 0.0–0.7)
EOS PCT: 0 %
HCT: 24.4 % — ABNORMAL LOW (ref 39.0–52.0)
Hemoglobin: 7.8 g/dL — ABNORMAL LOW (ref 13.0–17.0)
LYMPHS ABS: 0.6 10*3/uL — AB (ref 0.7–4.0)
Lymphocytes Relative: 12 %
MCH: 31 pg (ref 26.0–34.0)
MCHC: 32 g/dL (ref 30.0–36.0)
MCV: 96.8 fL (ref 78.0–100.0)
MONO ABS: 0.1 10*3/uL (ref 0.1–1.0)
MONOS PCT: 2 %
NEUTROS PCT: 86 %
Neutro Abs: 4.3 10*3/uL (ref 1.7–7.7)
PLATELETS: 118 10*3/uL — AB (ref 150–400)
RBC: 2.52 MIL/uL — ABNORMAL LOW (ref 4.22–5.81)
RDW: 25.4 % — AB (ref 11.5–15.5)
WBC: 5 10*3/uL (ref 4.0–10.5)

## 2016-04-30 LAB — SAMPLE TO BLOOD BANK

## 2016-05-01 ENCOUNTER — Other Ambulatory Visit (HOSPITAL_COMMUNITY): Payer: Self-pay | Admitting: *Deleted

## 2016-05-01 DIAGNOSIS — D469 Myelodysplastic syndrome, unspecified: Secondary | ICD-10-CM

## 2016-05-01 LAB — PREPARE RBC (CROSSMATCH)

## 2016-05-02 ENCOUNTER — Encounter (HOSPITAL_COMMUNITY)
Admission: RE | Admit: 2016-05-02 | Discharge: 2016-05-02 | Disposition: A | Discharge status: Home / Self Care | Payer: Medicare HMO | Source: Ambulatory Visit | Attending: Hematology & Oncology | Admitting: Hematology & Oncology

## 2016-05-02 DIAGNOSIS — D508 Other iron deficiency anemias: Secondary | ICD-10-CM

## 2016-05-02 DIAGNOSIS — D469 Myelodysplastic syndrome, unspecified: Secondary | ICD-10-CM | POA: Diagnosis not present

## 2016-05-02 MED ORDER — HEPARIN SOD (PORK) LOCK FLUSH 100 UNIT/ML IV SOLN: 500.0000 [IU] | Freq: Every day | INTRAVENOUS | Status: DC | PRN

## 2016-05-02 MED ORDER — SODIUM CHLORIDE 0.9% FLUSH: 10.0000 mL | INTRAVENOUS | Status: DC | PRN

## 2016-05-02 MED ORDER — ACETAMINOPHEN 325 MG PO TABS
650.0000 mg | ORAL_TABLET | Freq: Once | ORAL | Status: AC
  Administered 2016-05-02: 650 mg via ORAL
  Filled 2016-05-02: qty 2

## 2016-05-02 MED ORDER — SODIUM CHLORIDE 0.9 % IV SOLN
250.0000 mL | Freq: Once | INTRAVENOUS | Status: AC
  Administered 2016-05-02: 250 mL via INTRAVENOUS

## 2016-05-02 MED ORDER — DIPHENHYDRAMINE HCL 25 MG PO CAPS
25.0000 mg | ORAL_CAPSULE | Freq: Once | ORAL | Status: AC
  Administered 2016-05-02: 25 mg via ORAL
  Filled 2016-05-02: qty 1

## 2016-05-03 LAB — TYPE AND SCREEN
BLOOD PRODUCT EXPIRATION DATE: 201712272359
BLOOD PRODUCT EXPIRATION DATE: 201712312359
ISSUE DATE / TIME: 201712200757
ISSUE DATE / TIME: 201712201023
UNIT TYPE AND RH: 5100
Unit Type and Rh: 5100

## 2016-05-29 ENCOUNTER — Encounter (HOSPITAL_COMMUNITY): Payer: Medicare HMO | Attending: Hematology & Oncology

## 2016-05-29 DIAGNOSIS — D469 Myelodysplastic syndrome, unspecified: Secondary | ICD-10-CM | POA: Insufficient documentation

## 2016-05-29 DIAGNOSIS — D474 Osteomyelofibrosis: Secondary | ICD-10-CM

## 2016-05-29 DIAGNOSIS — D649 Anemia, unspecified: Secondary | ICD-10-CM

## 2016-05-29 LAB — COMPREHENSIVE METABOLIC PANEL
ALK PHOS: 103 U/L (ref 38–126)
ALT: 58 U/L (ref 17–63)
AST: 40 U/L (ref 15–41)
Albumin: 3.2 g/dL — ABNORMAL LOW (ref 3.5–5.0)
Anion gap: 5 (ref 5–15)
BILIRUBIN TOTAL: 0.4 mg/dL (ref 0.3–1.2)
BUN: 106 mg/dL — ABNORMAL HIGH (ref 6–20)
CALCIUM: 7.4 mg/dL — AB (ref 8.9–10.3)
CO2: 19 mmol/L — ABNORMAL LOW (ref 22–32)
CREATININE: 3.61 mg/dL — AB (ref 0.61–1.24)
Chloride: 113 mmol/L — ABNORMAL HIGH (ref 101–111)
GFR calc Af Amer: 18 mL/min — ABNORMAL LOW (ref >60)
GFR, EST NON AFRICAN AMERICAN: 15 mL/min — AB (ref >60)
Glucose, Bld: 102 mg/dL — ABNORMAL HIGH (ref 65–99)
Potassium: 5.4 mmol/L — ABNORMAL HIGH (ref 3.5–5.1)
Sodium: 137 mmol/L (ref 135–145)
TOTAL PROTEIN: 6.2 g/dL — AB (ref 6.5–8.1)

## 2016-05-29 LAB — CBC WITH DIFFERENTIAL/PLATELET
BASOS PCT: 0 %
Basophils Absolute: 0 10*3/uL (ref 0.0–0.1)
Eosinophils Absolute: 0 10*3/uL (ref 0.0–0.7)
Eosinophils Relative: 0 %
HEMATOCRIT: 22.7 % — AB (ref 39.0–52.0)
HEMOGLOBIN: 7.4 g/dL — AB (ref 13.0–17.0)
Lymphocytes Relative: 12 %
Lymphs Abs: 0.7 10*3/uL (ref 0.7–4.0)
MCH: 30.3 pg (ref 26.0–34.0)
MCHC: 32.6 g/dL (ref 30.0–36.0)
MCV: 93 fL (ref 78.0–100.0)
MONO ABS: 0.1 10*3/uL (ref 0.1–1.0)
MONOS PCT: 2 %
NEUTROS ABS: 5.3 10*3/uL (ref 1.7–7.7)
NRBC: 1 /100{WBCs} — AB
Neutrophils Relative %: 86 %
PLATELETS: 95 10*3/uL — AB (ref 150–400)
RBC: 2.44 MIL/uL — ABNORMAL LOW (ref 4.22–5.81)
RDW: 24.4 % — ABNORMAL HIGH (ref 11.5–15.5)
WBC: 6.1 10*3/uL (ref 4.0–10.5)

## 2016-05-29 LAB — SAMPLE TO BLOOD BANK

## 2016-05-31 ENCOUNTER — Other Ambulatory Visit (HOSPITAL_COMMUNITY): Payer: Self-pay | Admitting: Hematology & Oncology

## 2016-05-31 DIAGNOSIS — D474 Osteomyelofibrosis: Secondary | ICD-10-CM

## 2016-05-31 DIAGNOSIS — D469 Myelodysplastic syndrome, unspecified: Secondary | ICD-10-CM

## 2016-05-31 DIAGNOSIS — E876 Hypokalemia: Secondary | ICD-10-CM

## 2016-06-01 ENCOUNTER — Encounter (HOSPITAL_COMMUNITY): Payer: Medicare HMO

## 2016-06-01 DIAGNOSIS — D469 Myelodysplastic syndrome, unspecified: Secondary | ICD-10-CM | POA: Diagnosis not present

## 2016-06-01 DIAGNOSIS — D649 Anemia, unspecified: Secondary | ICD-10-CM

## 2016-06-01 LAB — PREPARE RBC (CROSSMATCH)

## 2016-06-04 ENCOUNTER — Encounter (HOSPITAL_COMMUNITY): Payer: Self-pay

## 2016-06-04 ENCOUNTER — Encounter (HOSPITAL_COMMUNITY)
Admission: RE | Admit: 2016-06-04 | Discharge: 2016-06-04 | Disposition: A | Discharge status: Home / Self Care | Payer: Medicare HMO | Source: Ambulatory Visit | Attending: Hematology & Oncology | Admitting: Hematology & Oncology

## 2016-06-04 DIAGNOSIS — D649 Anemia, unspecified: Secondary | ICD-10-CM

## 2016-06-04 DIAGNOSIS — D469 Myelodysplastic syndrome, unspecified: Secondary | ICD-10-CM | POA: Insufficient documentation

## 2016-06-04 MED ORDER — DIPHENHYDRAMINE HCL 25 MG PO CAPS
25.0000 mg | ORAL_CAPSULE | Freq: Once | ORAL | Status: AC
  Administered 2016-06-04: 25 mg via ORAL
  Filled 2016-06-04: qty 1

## 2016-06-04 MED ORDER — SODIUM CHLORIDE 0.9 % IV SOLN: 250.0000 mL | Freq: Once | INTRAVENOUS | Status: DC

## 2016-06-04 MED ORDER — HEPARIN SOD (PORK) LOCK FLUSH 100 UNIT/ML IV SOLN: 500.0000 [IU] | Freq: Every day | INTRAVENOUS | Status: DC | PRN

## 2016-06-04 MED ORDER — ACETAMINOPHEN 325 MG PO TABS
650.0000 mg | ORAL_TABLET | Freq: Once | ORAL | Status: AC
  Administered 2016-06-04: 650 mg via ORAL
  Filled 2016-06-04: qty 2

## 2016-06-04 MED ORDER — SODIUM CHLORIDE 0.9% FLUSH: 10.0000 mL | INTRAVENOUS | Status: DC | PRN

## 2016-06-05 LAB — TYPE AND SCREEN
BLOOD PRODUCT EXPIRATION DATE: 201802022359
Blood Product Expiration Date: 201802022359
ISSUE DATE / TIME: 201801220758
ISSUE DATE / TIME: 201801221006
UNIT TYPE AND RH: 5100
Unit Type and Rh: 5100

## 2016-06-18 ENCOUNTER — Other Ambulatory Visit (HOSPITAL_COMMUNITY): Payer: Self-pay

## 2016-06-18 DIAGNOSIS — E876 Hypokalemia: Secondary | ICD-10-CM

## 2016-06-18 DIAGNOSIS — D469 Myelodysplastic syndrome, unspecified: Secondary | ICD-10-CM

## 2016-06-18 DIAGNOSIS — D474 Osteomyelofibrosis: Secondary | ICD-10-CM

## 2016-06-18 MED ORDER — POTASSIUM CHLORIDE CRYS ER 20 MEQ PO TBCR: 20.0000 meq | EXTENDED_RELEASE_TABLET | Freq: Every day | ORAL | 2 refills | Status: DC

## 2016-06-20 ENCOUNTER — Other Ambulatory Visit (HOSPITAL_COMMUNITY): Payer: Self-pay

## 2016-06-20 DIAGNOSIS — D474 Osteomyelofibrosis: Secondary | ICD-10-CM

## 2016-06-20 DIAGNOSIS — D469 Myelodysplastic syndrome, unspecified: Secondary | ICD-10-CM

## 2016-06-20 DIAGNOSIS — D471 Chronic myeloproliferative disease: Secondary | ICD-10-CM

## 2016-06-20 DIAGNOSIS — E876 Hypokalemia: Secondary | ICD-10-CM

## 2016-06-20 MED ORDER — POTASSIUM CHLORIDE CRYS ER 20 MEQ PO TBCR: 20.0000 meq | EXTENDED_RELEASE_TABLET | Freq: Every day | ORAL | 1 refills | Status: DC

## 2016-06-20 NOTE — Telephone Encounter (Signed)
Received refill request from patients pharmacy for 90-day supply . Chart checked, reviewed with PA-C, and refilled.

## 2016-06-25 ENCOUNTER — Other Ambulatory Visit (HOSPITAL_COMMUNITY): Payer: Medicare HMO

## 2016-07-02 ENCOUNTER — Telehealth (HOSPITAL_COMMUNITY): Payer: Self-pay

## 2016-07-02 ENCOUNTER — Other Ambulatory Visit (HOSPITAL_COMMUNITY): Payer: Self-pay | Admitting: Oncology

## 2016-07-02 DIAGNOSIS — D469 Myelodysplastic syndrome, unspecified: Secondary | ICD-10-CM

## 2016-07-02 NOTE — Telephone Encounter (Signed)
Angie from [facility] pharmacy called stating patient has not gotten a shipment of his Ferriprox medication since December. She has called numerous times and sent Fed-ex letters with no response from Mr. Nicolas Gibson. She called to be sure there have been no changes to his regimen. According to patients last visit note he is supposed to still be on medication. Angie states she will still try to contact patient.

## 2016-07-02 NOTE — Telephone Encounter (Signed)
Correct.  He is suppose to be on it.  TK

## 2016-07-03 ENCOUNTER — Encounter (HOSPITAL_COMMUNITY): Payer: Medicare HMO | Attending: Oncology

## 2016-07-03 ENCOUNTER — Other Ambulatory Visit (HOSPITAL_COMMUNITY): Payer: Self-pay | Admitting: Oncology

## 2016-07-03 DIAGNOSIS — D471 Chronic myeloproliferative disease: Secondary | ICD-10-CM

## 2016-07-03 DIAGNOSIS — D469 Myelodysplastic syndrome, unspecified: Secondary | ICD-10-CM | POA: Diagnosis present

## 2016-07-03 LAB — CBC WITH DIFFERENTIAL/PLATELET
BASOS PCT: 0 %
Basophils Absolute: 0 10*3/uL (ref 0.0–0.1)
EOS ABS: 0 10*3/uL (ref 0.0–0.7)
EOS PCT: 0 %
HEMATOCRIT: 19.5 % — AB (ref 39.0–52.0)
HEMOGLOBIN: 6.1 g/dL — AB (ref 13.0–17.0)
LYMPHS PCT: 13 %
Lymphs Abs: 0.6 10*3/uL — ABNORMAL LOW (ref 0.7–4.0)
MCH: 30 pg (ref 26.0–34.0)
MCHC: 31.3 g/dL (ref 30.0–36.0)
MCV: 96.1 fL (ref 78.0–100.0)
Monocytes Absolute: 0.1 10*3/uL (ref 0.1–1.0)
Monocytes Relative: 2 %
NEUTROS ABS: 3.8 10*3/uL (ref 1.7–7.7)
NEUTROS PCT: 85 %
PLATELETS: 71 10*3/uL — AB (ref 150–400)
RBC: 2.03 MIL/uL — ABNORMAL LOW (ref 4.22–5.81)
RDW: 26 % — ABNORMAL HIGH (ref 11.5–15.5)
WBC: 4.5 10*3/uL (ref 4.0–10.5)

## 2016-07-03 LAB — COMPREHENSIVE METABOLIC PANEL
ALK PHOS: 78 U/L (ref 38–126)
ALT: 27 U/L (ref 17–63)
AST: 21 U/L (ref 15–41)
Albumin: 2.9 g/dL — ABNORMAL LOW (ref 3.5–5.0)
Anion gap: 10 (ref 5–15)
BILIRUBIN TOTAL: 0.5 mg/dL (ref 0.3–1.2)
BUN: 112 mg/dL — AB (ref 6–20)
CALCIUM: 6.2 mg/dL — AB (ref 8.9–10.3)
CHLORIDE: 119 mmol/L — AB (ref 101–111)
CO2: 16 mmol/L — ABNORMAL LOW (ref 22–32)
CREATININE: 3.96 mg/dL — AB (ref 0.61–1.24)
GFR, EST AFRICAN AMERICAN: 16 mL/min — AB (ref >60)
GFR, EST NON AFRICAN AMERICAN: 14 mL/min — AB (ref >60)
Glucose, Bld: 109 mg/dL — ABNORMAL HIGH (ref 65–99)
Potassium: 4.4 mmol/L (ref 3.5–5.1)
Sodium: 145 mmol/L (ref 135–145)
Total Protein: 5.9 g/dL — ABNORMAL LOW (ref 6.5–8.1)

## 2016-07-03 LAB — PREPARE RBC (CROSSMATCH)

## 2016-07-03 MED ORDER — CALCIUM CARBONATE-VITAMIN D 500-200 MG-UNIT PO TABS: 3.0000 | ORAL_TABLET | Freq: Every day | ORAL | 5 refills | Status: DC

## 2016-07-03 NOTE — Progress Notes (Signed)
CRITICAL VALUE ALERT Critical value received:  Calcium 6.2  Date of notification:  07/03/16 Time of notification: 1600 Critical value read back:  Yes.   Nurse who received alert:  A.Darnelle Maffucci LPN MD notified (1st page):  [employer].

## 2016-07-03 NOTE — Progress Notes (Signed)
CRITICAL VALUE ALERT Critical value received:  Hemoglobin  Date of notification:  07/03/2016 Time of notification: [id number] Critical value read back:  Yes.   Nurse who received alert:  Forest Gleason RN MD notified (1st page):  Kirby Crigler PA-C

## 2016-07-04 ENCOUNTER — Encounter (HOSPITAL_BASED_OUTPATIENT_CLINIC_OR_DEPARTMENT_OTHER): Payer: Medicare HMO

## 2016-07-04 ENCOUNTER — Other Ambulatory Visit (HOSPITAL_COMMUNITY): Payer: Medicare HMO

## 2016-07-04 DIAGNOSIS — D469 Myelodysplastic syndrome, unspecified: Secondary | ICD-10-CM

## 2016-07-04 DIAGNOSIS — D649 Anemia, unspecified: Secondary | ICD-10-CM

## 2016-07-04 DIAGNOSIS — D471 Chronic myeloproliferative disease: Secondary | ICD-10-CM | POA: Diagnosis not present

## 2016-07-04 MED ORDER — DIPHENHYDRAMINE HCL 25 MG PO CAPS
25.0000 mg | ORAL_CAPSULE | Freq: Once | ORAL | Status: AC
  Administered 2016-07-04: 25 mg via ORAL
  Filled 2016-07-04: qty 1

## 2016-07-04 MED ORDER — HEPARIN SOD (PORK) LOCK FLUSH 100 UNIT/ML IV SOLN: 500.0000 [IU] | Freq: Every day | INTRAVENOUS | Status: DC | PRN

## 2016-07-04 MED ORDER — ACETAMINOPHEN 325 MG PO TABS
650.0000 mg | ORAL_TABLET | Freq: Once | ORAL | Status: AC
  Administered 2016-07-04: 650 mg via ORAL
  Filled 2016-07-04: qty 2

## 2016-07-04 MED ORDER — SODIUM CHLORIDE 0.9 % IV SOLN
250.0000 mL | Freq: Once | INTRAVENOUS | Status: AC
  Administered 2016-07-04: 250 mL via INTRAVENOUS

## 2016-07-04 MED ORDER — SODIUM CHLORIDE 0.9% FLUSH: 10.0000 mL | INTRAVENOUS | Status: DC | PRN

## 2016-07-04 NOTE — Progress Notes (Signed)
Nicolas Gibson tolerated blood transfusion well without complaints or incident. VSS upon discharge. Pt discharged via wheelchair in satisfactory condition 

## 2016-07-04 NOTE — Patient Instructions (Signed)
[facility] at [facility] Discharge Instructions  RECOMMENDATIONS MADE BY THE CONSULTANT AND ANY TEST RESULTS WILL BE SENT TO YOUR REFERRING PHYSICIAN.  Received 2 units of blood today. Follow-up as scheduled. Call clinic for any questions or concerns  Thank you for choosing  [facility] at [facility] to provide your oncology and hematology care.  To afford each patient quality time with our provider, please arrive at least 15 minutes before your scheduled appointment time.    If you have a lab appointment with the Cancer Center please come in thru the  Main Entrance and check in at the main information desk  You need to re-schedule your appointment should you arrive 10 or more minutes late.  We strive to give you quality time with our providers, and arriving late affects you and other patients whose appointments are after yours.  Also, if you no show three or more times for appointments you may be dismissed from the clinic at the providers discretion.     Again, thank you for choosing [facility].  Our hope is that these requests will decrease the amount of time that you wait before being seen by our physicians.       _____________________________________________________________  Should you have questions after your visit to [facility], please contact our office at [phone] between the hours of 8:30 a.m. and 4:30 p.m.  Voicemails left after 4:30 p.m. will not be returned until the following business day.  For prescription refill requests, have your pharmacy contact our office.       Resources For Cancer Patients and their Caregivers ? American Cancer Society: Can assist with transportation, wigs, general needs, runs Look Good Feel Better.        1-888-227-6333 ? Cancer Care: Provides financial assistance, online support groups, medication/co-pay assistance.  1-800-813-HOPE (4673) ? [facility] Assists [place] cancer patients and their families through emotional , educational and financial support.  336-427-4357 ? [place] DSS Where to apply for food stamps, Medicaid and utility assistance. 336-342-1394 ? RCATS: Transportation to medical appointments. 336-347-2287 ? Social Security Administration: May apply for disability if have a Stage IV cancer. 336-342-7796 1-800-772-1213 ? [place] Aging, Disability and Transit Services: Assists with nutrition, care and transit needs. 336-349-2343  Cancer Center Support Programs: @10RELATIVEDAYS@ > Cancer Support Group  2nd Tuesday of the month 1pm-2pm, Journey Room  > Creative Journey  3rd Tuesday of the month 1130am-1pm, Journey Room  > Look Good Feel Better  1st Wednesday of the month 10am-12 noon, Journey Room (Call [employer] to register 1-800-395-5775)   

## 2016-07-05 LAB — TYPE AND SCREEN
Blood Product Expiration Date: 201803042359
Blood Product Expiration Date: 201803212359
ISSUE DATE / TIME: 201802211039
ISSUE DATE / TIME: 201802211254
Unit Type and Rh: 5100
Unit Type and Rh: 5100

## 2016-07-18 ENCOUNTER — Ambulatory Visit (HOSPITAL_COMMUNITY): Payer: Medicare HMO | Admitting: Oncology

## 2016-07-18 ENCOUNTER — Other Ambulatory Visit (HOSPITAL_COMMUNITY): Payer: Medicare HMO

## 2016-07-18 NOTE — Progress Notes (Deleted)
Nicolas Morel, MD [address] [place] [address]  No diagnosis found.  CURRENT THERAPY: Blood transfusion PRN and iron chelation therapy.  INTERVAL HISTORY: Nicolas Gibson 76 y.o. male returns for followup of chronic myelofibrosis, JAK2 POSITIVE with poor tolerance (according to records) to [facility].  Transfusion dependent requiring monthly transfusion and iron overloaded secondary repeated transfusions, on Deferiprone.    Primary myelofibrosis ([place])   06/27/2012 Initial Diagnosis    Primary myelofibrosis ([place])      12/08/2015 Imaging    Abdominal Ultrasound shows interval increase in the volume of the spleen which now measures 1806 cc. 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 Increased renal cortical echotexture bilaterally. Cystic structures in the right kidney with an upper pole complex appearing structure not well characterized. Normal appearance of the gallbladder and liver and pancreas. Trace of ascites adjacent to the liver.       ***  ROS  Past Medical History:  Diagnosis Date  . Anemia    He said teh cause has not been determined. His last transfusion requirement was about 4 years ago.  Marland Kitchen History of gout   . Hypertension   . Paroxysmal atrial fibrillation (HCC)   . Primary myelofibrosis ([place]) 11/25/2015    Past Surgical History:  Procedure Laterality Date  . Right inguinal hernia repair      Family History  Problem Relation Age of Onset  . Heart failure Mother   . Hypertension Mother   . Kidney failure Father   . Hypertension Father   . Diabetes Brother   . Hypertension Brother     Social History   Social History  . Marital status: Married    Spouse name: N/A  . Number of children: 1  . Years of education: N/A   Occupational History  . Retired    Social History Main Topics  . Smoking status: Former Smoker    Types: Cigarettes    Quit date: 05/14/1981  . Smokeless tobacco: Not on file  . Alcohol use No  . Drug use:  No  . Sexual activity: Not on file   Other Topics Concern  . Not on file   Social History Narrative  . No narrative on file     PHYSICAL EXAMINATION  ECOG PERFORMANCE STATUS: {CHL ONC ECOG PS:[phone]}  There were no vitals filed for this visit.  GENERAL:{CHL ONC PE GENERAL:[phone]} SKIN: {CHL ONC PE MVHQ:[id number]} HEAD: {CHL ONC PE XLKG:4010272536} EYES: {CHL ONC PE UYQI:3474259563} EARS: {CHL ONC PE OVFI:4332951884} OROPHARYNX:{CHL ONC PE [phone]}  NECK: {CHL ONC PE ZYSA:6301601093} LYMPH:  {CHL ONC PE ATFTD:3220254270} BREAST:{CHL ONC PE BREAST:[phone]} LUNGS: {CHL ONC PE WCBJS:2831517616} HEART: {CHL ONC PE WVPXT:0626948546} ABDOMEN:{CHL ONC PE [phone]} BACK: {CHL ONC PE EVOJ:5009381829} EXTREMITIES:{CHL ONC PE EXTREMITIES:[phone]}  NEURO: {CHL ONC PE [phone]} PELVIC:{CHL ONC PE PELVIC:[phone]} RECTAL: {CHL ONC PE [phone]}   LABORATORY DATA: CBC    Component Value Date/Time   WBC 4.5 07/03/2016 1509   RBC 2.03 (L) 07/03/2016 1509   HGB 6.1 (LL) 07/03/2016 1509   HCT 19.5 (L) 07/03/2016 1509   PLT 71 (L) 07/03/2016 1509   MCV 96.1 07/03/2016 1509   MCH 30.0 07/03/2016 1509   MCHC 31.3 07/03/2016 1509   RDW 26.0 (H) 07/03/2016 1509   LYMPHSABS 0.6 (L) 07/03/2016 1509   MONOABS 0.1 07/03/2016 1509   EOSABS 0.0 07/03/2016 1509   BASOSABS 0.0 07/03/2016 1509      Chemistry      Component Value  Date/Time   NA 145 07/03/2016 1509   K 4.4 07/03/2016 1509   CL 119 (H) 07/03/2016 1509   CO2 16 (L) 07/03/2016 1509   BUN 112 (H) 07/03/2016 1509   CREATININE 3.96 (H) 07/03/2016 1509      Component Value Date/Time   CALCIUM 6.2 (LL) 07/03/2016 1509   ALKPHOS 78 07/03/2016 1509   AST 21 07/03/2016 1509   ALT 27 07/03/2016 1509   BILITOT 0.5 07/03/2016 1509        PENDING LABS:   RADIOGRAPHIC STUDIES:  No results found.   PATHOLOGY:    ASSESSMENT AND PLAN:  No problem-specific  Assessment & Plan notes found for this encounter.   ORDERS PLACED FOR THIS ENCOUNTER: No orders of the defined types were placed in this encounter.   MEDICATIONS PRESCRIBED THIS ENCOUNTER: No orders of the defined types were placed in this encounter.   THERAPY PLAN:  PRBC transfusion PRN and ongoing iron chelation therapy.  All questions were answered. The patient knows to call the clinic with any problems, questions or concerns. We can certainly see the patient much sooner if necessary.  Patient and plan discussed with Dr. Twana First and she is in agreement with the aforementioned.   This note is electronically signed by: Doy Mince 07/18/2016 9:11 AM

## 2016-07-18 NOTE — Assessment & Plan Note (Deleted)
Chronic myelofibrosis, JAK2 POSITIVE with poor tolerance (according to records) to [facility].  Transfusion dependent requiring monthly transfusion and iron overloaded secondary repeated transfusions, on Deferiprone.  Oncology history is up to date.  Labs today: CBC diff, CMET, ferritin, sample to blood bank.  I personally reviewed and went over laboratory results with the patient.  The results are noted within this dictation.  Labs monthly: CBC diff, CMET, ferritin, sample to blood bank.  PRBCs PRN (really at patient's discretion).  Continue with iron chelation as directed.  We received word from his pharmacy that he has not received his Deferiprone since December 2017.  Return in 2-3 months for follow-up.

## 2016-07-31 ENCOUNTER — Encounter (HOSPITAL_COMMUNITY): Payer: Medicare HMO | Attending: Oncology

## 2016-07-31 DIAGNOSIS — D649 Anemia, unspecified: Secondary | ICD-10-CM | POA: Diagnosis not present

## 2016-07-31 DIAGNOSIS — D474 Osteomyelofibrosis: Secondary | ICD-10-CM

## 2016-07-31 DIAGNOSIS — Z1589 Genetic susceptibility to other disease: Secondary | ICD-10-CM | POA: Insufficient documentation

## 2016-07-31 DIAGNOSIS — D471 Chronic myeloproliferative disease: Secondary | ICD-10-CM | POA: Diagnosis not present

## 2016-07-31 DIAGNOSIS — D638 Anemia in other chronic diseases classified elsewhere: Secondary | ICD-10-CM

## 2016-07-31 LAB — COMPREHENSIVE METABOLIC PANEL
ALT: 31 U/L (ref 17–63)
AST: 31 U/L (ref 15–41)
Albumin: 3 g/dL — ABNORMAL LOW (ref 3.5–5.0)
Alkaline Phosphatase: 76 U/L (ref 38–126)
Anion gap: 11 (ref 5–15)
BUN: 96 mg/dL — AB (ref 6–20)
CHLORIDE: 113 mmol/L — AB (ref 101–111)
CO2: 17 mmol/L — AB (ref 22–32)
Calcium: 5.7 mg/dL — CL (ref 8.9–10.3)
Creatinine, Ser: 4.33 mg/dL — ABNORMAL HIGH (ref 0.61–1.24)
GFR calc Af Amer: 14 mL/min — ABNORMAL LOW (ref >60)
GFR, EST NON AFRICAN AMERICAN: 12 mL/min — AB (ref >60)
Glucose, Bld: 134 mg/dL — ABNORMAL HIGH (ref 65–99)
POTASSIUM: 4.6 mmol/L (ref 3.5–5.1)
SODIUM: 141 mmol/L (ref 135–145)
Total Bilirubin: 0.5 mg/dL (ref 0.3–1.2)
Total Protein: 6 g/dL — ABNORMAL LOW (ref 6.5–8.1)

## 2016-07-31 LAB — CBC WITH DIFFERENTIAL/PLATELET
Basophils Absolute: 0 10*3/uL (ref 0.0–0.1)
Basophils Relative: 0 %
EOS PCT: 1 %
Eosinophils Absolute: 0.1 10*3/uL (ref 0.0–0.7)
HEMATOCRIT: 23.4 % — AB (ref 39.0–52.0)
HEMOGLOBIN: 7.4 g/dL — AB (ref 13.0–17.0)
Lymphocytes Relative: 14 %
Lymphs Abs: 0.8 10*3/uL (ref 0.7–4.0)
MCH: 30.1 pg (ref 26.0–34.0)
MCHC: 31.6 g/dL (ref 30.0–36.0)
MCV: 95.1 fL (ref 78.0–100.0)
MONOS PCT: 2 %
Monocytes Absolute: 0.1 10*3/uL (ref 0.1–1.0)
NEUTROS PCT: 83 %
Neutro Abs: 4.9 10*3/uL (ref 1.7–7.7)
Platelets: 103 10*3/uL — ABNORMAL LOW (ref 150–400)
RBC: 2.46 MIL/uL — AB (ref 4.22–5.81)
RDW: 26.8 % — ABNORMAL HIGH (ref 11.5–15.5)
WBC: 5.9 10*3/uL (ref 4.0–10.5)

## 2016-07-31 LAB — SAMPLE TO BLOOD BANK

## 2016-07-31 NOTE — Progress Notes (Signed)
CRITICAL VALUE ALERT Critical value received:  Calcium- 5.7 Date of notification:  07/31/16 Time of notification: 1523 Critical value read back:  Yes.   Nurse who received alert:  M.Greco Gastelum, LPN  MD notified (1st page):  T.Kefalas, PA-C

## 2016-08-02 ENCOUNTER — Encounter (HOSPITAL_COMMUNITY): Payer: Self-pay | Admitting: Hematology

## 2016-08-02 ENCOUNTER — Encounter (HOSPITAL_BASED_OUTPATIENT_CLINIC_OR_DEPARTMENT_OTHER): Payer: Medicare HMO | Admitting: Hematology

## 2016-08-02 ENCOUNTER — Encounter (HOSPITAL_BASED_OUTPATIENT_CLINIC_OR_DEPARTMENT_OTHER): Payer: Medicare HMO

## 2016-08-02 VITALS — BP 134/73 | HR 49 | Temp 97.7°F | Resp 16 | Wt 194.4 lb

## 2016-08-02 DIAGNOSIS — D471 Chronic myeloproliferative disease: Secondary | ICD-10-CM

## 2016-08-02 DIAGNOSIS — Z1589 Genetic susceptibility to other disease: Secondary | ICD-10-CM

## 2016-08-02 DIAGNOSIS — I429 Cardiomyopathy, unspecified: Secondary | ICD-10-CM

## 2016-08-02 DIAGNOSIS — D731 Hypersplenism: Secondary | ICD-10-CM | POA: Diagnosis not present

## 2016-08-02 DIAGNOSIS — D696 Thrombocytopenia, unspecified: Secondary | ICD-10-CM

## 2016-08-02 DIAGNOSIS — R161 Splenomegaly, not elsewhere classified: Secondary | ICD-10-CM | POA: Diagnosis not present

## 2016-08-02 DIAGNOSIS — D649 Anemia, unspecified: Secondary | ICD-10-CM | POA: Diagnosis not present

## 2016-08-02 DIAGNOSIS — D638 Anemia in other chronic diseases classified elsewhere: Secondary | ICD-10-CM

## 2016-08-02 LAB — PREPARE RBC (CROSSMATCH)

## 2016-08-02 MED ORDER — SODIUM CHLORIDE 0.9 % IV SOLN
250.0000 mL | Freq: Once | INTRAVENOUS | Status: AC
  Administered 2016-08-02: 250 mL via INTRAVENOUS

## 2016-08-02 MED ORDER — ACETAMINOPHEN 325 MG PO TABS
650.0000 mg | ORAL_TABLET | Freq: Once | ORAL | Status: AC
  Administered 2016-08-02: 650 mg via ORAL

## 2016-08-02 MED ORDER — DIPHENHYDRAMINE HCL 25 MG PO CAPS
ORAL_CAPSULE | ORAL | Status: AC
  Filled 2016-08-02: qty 1

## 2016-08-02 MED ORDER — ACETAMINOPHEN 325 MG PO TABS
ORAL_TABLET | ORAL | Status: AC
  Filled 2016-08-02: qty 2

## 2016-08-02 MED ORDER — CALCIUM GLUCONATE 10 % IV SOLN
2.0000 g | Freq: Once | INTRAVENOUS | Status: AC
  Administered 2016-08-02: 2 g via INTRAVENOUS
  Filled 2016-08-02: qty 20

## 2016-08-02 MED ORDER — DIPHENHYDRAMINE HCL 25 MG PO CAPS
25.0000 mg | ORAL_CAPSULE | Freq: Once | ORAL | Status: AC
  Administered 2016-08-02: 25 mg via ORAL

## 2016-08-02 NOTE — Patient Instructions (Signed)
[place] at [facility] Discharge Instructions  RECOMMENDATIONS MADE BY THE CONSULTANT AND ANY TEST RESULTS WILL BE SENT TO YOUR REFERRING PHYSICIAN.  You were seen today by Dr. Irene Limbo Next month get lab work drawn and we will notify you of the results. Follow up in 2 months with lab work See Amy up front for appointments  Increase your vitamin D to 2000 iu daily, that is 2 of your pills instead of one. Get an appointment with your kidney doctor as soon as possible.   Thank you for choosing [place] at [facility] to provide your oncology and hematology care.  To afford each patient quality time with our provider, please arrive at least 15 minutes before your scheduled appointment time.    If you have a lab appointment with the [place] please come in thru the  Main Entrance and check in at the main information desk  You need to re-schedule your appointment should you arrive 10 or more minutes late.  We strive to give you quality time with our providers, and arriving late affects you and other patients whose appointments are after yours.  Also, if you no show three or more times for appointments you may be dismissed from the clinic at the providers discretion.     Again, thank you for choosing [facility].  Our hope is that these requests will decrease the amount of time that you wait before being seen by our physicians.       _____________________________________________________________  Should you have questions after your visit to [facility], please contact our office at [phone] between the hours of 8:30 a.m. and 4:30 p.m.  Voicemails left after 4:30 p.m. will not be returned until the following business day.  For prescription refill requests, have your pharmacy contact our office.       Resources For Cancer Patients and their Caregivers ? American Cancer Society: Can assist with transportation,  wigs, general needs, runs Look Good Feel Better.        947-275-8134 ? Cancer Care: Provides financial assistance, online support groups, medication/co-pay assistance.  1-800-813-HOPE 613-805-4620) ? [facility] Assists [place] cancer patients and their families through emotional , educational and financial support.  (586) 103-1691 ? [place] DSS Where to apply for food stamps, Medicaid and utility assistance. 907-742-0826 ? RCATS: Transportation to medical appointments. (302)340-0932 ? Social Security Administration: May apply for disability if have a Stage IV cancer. 361-475-2407 (518)846-5516 ? [employer], Disability and Transit Services: Assists with nutrition, care and transit needs. [place] Support Programs: @10RELATIVEDAYS @ > Cancer Support Group  2nd Tuesday of the month 1pm-2pm, Journey Room  > Creative Journey  3rd Tuesday of the month 1130am-1pm, Journey Room  > Look Good Feel Better  1st Wednesday of the month 10am-12 noon, Journey Room (Call [place] to register [phone])

## 2016-08-02 NOTE — Progress Notes (Signed)
Patient reports not taking his medication as prescribed.  The deferiprone is only taken 2 tablets at a time or not at all.  Patient reports that he has itching or is nauseated when taking this medication.  Provider aware and patient advised to get in to see his nephrologist.

## 2016-08-02 NOTE — Progress Notes (Signed)
Tolerated calcium infusion well.  Tolerated 1st unit PRBC transfusion well.  Tolerated 2nd unit PRBC transfusion well.  Stable on discharge home with wife via wheelchair.

## 2016-08-02 NOTE — Patient Instructions (Signed)
[place] at [facility] Discharge Instructions  RECOMMENDATIONS MADE BY THE CONSULTANT AND ANY TEST RESULTS WILL BE SENT TO YOUR REFERRING PHYSICIAN.  Today you received a calcium infusion. You also received a transfusion of 2 units packed red blood cells. Return as scheduled.  Thank you for choosing [place] at [facility] to provide your oncology and hematology care.  To afford each patient quality time with our provider, please arrive at least 15 minutes before your scheduled appointment time.    If you have a lab appointment with the [place] please come in thru the  Main Entrance and check in at the main information desk  You need to re-schedule your appointment should you arrive 10 or more minutes late.  We strive to give you quality time with our providers, and arriving late affects you and other patients whose appointments are after yours.  Also, if you no show three or more times for appointments you may be dismissed from the clinic at the providers discretion.     Again, thank you for choosing [facility].  Our hope is that these requests will decrease the amount of time that you wait before being seen by our physicians.       _____________________________________________________________  Should you have questions after your visit to [facility], please contact our office at [phone] between the hours of 8:30 a.m. and 4:30 p.m.  Voicemails left after 4:30 p.m. will not be returned until the following business day.  For prescription refill requests, have your pharmacy contact our office.       Resources For Cancer Patients and their Caregivers ? American Cancer Society: Can assist with transportation, wigs, general needs, runs Look Good Feel Better.        463-291-1965 ? Cancer Care: Provides financial assistance, online support groups, medication/co-pay assistance.  1-800-813-HOPE  878-653-6293) ? [place] Assists [place] cancer patients and their families through emotional , educational and financial support.  (425)271-4609 ? [place] DSS Where to apply for food stamps, Medicaid and utility assistance. 332-009-1437 ? RCATS: Transportation to medical appointments. 406-806-6843 ? Social Security Administration: May apply for disability if have a Stage IV cancer. [phone] 503-536-4386 ? [employer], Disability and Transit Services: Assists with nutrition, care and transit needs. [place] Support Programs: @10RELATIVEDAYS @ > Cancer Support Group  2nd Tuesday of the month 1pm-2pm, Journey Room  > Creative Journey  3rd Tuesday of the month 1130am-1pm, Journey Room  > Look Good Feel Better  1st Wednesday of the month 10am-12 noon, Journey Room (Call [place] to register [phone])

## 2016-08-03 LAB — TYPE AND SCREEN
ABO/RH(D): O POS
ANTIBODY SCREEN: NEGATIVE
UNIT DIVISION: 0
UNIT DIVISION: 0

## 2016-08-03 LAB — BPAM RBC
Blood Product Expiration Date: 201804102359
Blood Product Expiration Date: 201804192359
ISSUE DATE / TIME: 201803221203
ISSUE DATE / TIME: 201803220958
UNIT TYPE AND RH: 5100
Unit Type and Rh: 5100

## 2016-08-04 NOTE — Progress Notes (Signed)
Marland Kitchen  HEMATOLOGY ONCOLOGY PROGRESS NOTE  Date of service: .08/02/2016  Patient Care Team: [name], MD as PCP - General (Internal Medicine)  CC:  f/u for Jak2 V617F mutation positive Myelofibrosis  Diagnosis:   Jak2 V617F mutation positive Myelofibrosis  Current Treatment:   Blood transfusion PRN (needing 1-2 units of PRBC's every month). iron chelation therapy  deferiprone Was previously on Jakafi but cannot tolerate this due to significant cytopenias.   SUMMARY OF ONCOLOGIC HISTORY:   Primary myelofibrosis ([place])   06/27/2012 Initial Diagnosis    Primary myelofibrosis ([place])      12/08/2015 Imaging    Abdominal Ultrasound shows interval increase in the volume of the spleen which now measures 1806 cc. 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 Increased renal cortical echotexture bilaterally. Cystic structures in the right kidney with an upper pole complex appearing structure not well characterized. Normal appearance of the gallbladder and liver and pancreas. Trace of ascites adjacent to the liver.        INTERVAL HISTORY: Patient is here for his scheduled follow-up for myelofibrosis. He notes no acute new symptoms. Still needing about 1-2 units of PRBCs every month. Last ultrasound of the spleen in July 2017 had showed increased spleen size. Continues to follow with [facility] cardiology. iron overloaded secondary repeated transfusions, on Deferiprone -notes he is taking it regularly.Marland Kitchen  REVIEW OF SYSTEMS:    10 Point review of systems of done and is negative except as noted above.  . Past Medical History:  Diagnosis Date  . Anemia    He said teh cause has not been determined. His last transfusion requirement was about 4 years ago.  Marland Kitchen History of gout   . Hypertension   . Paroxysmal atrial fibrillation (HCC)   . Primary myelofibrosis ([place]) 11/25/2015    . Past Surgical History:  Procedure Laterality Date  . Right inguinal hernia repair      . Social History  Substance Use  Topics  . Smoking status: Former Smoker    Types: Cigarettes    Quit date: 05/14/1981  . Smokeless tobacco: Not on file  . Alcohol use No    ALLERGIES:  is allergic to lisinopril.  MEDICATIONS:  Current Outpatient Prescriptions  Medication Sig Dispense Refill  . allopurinol (ZYLOPRIM) 100 MG tablet Take by mouth.    Marland Kitchen aspirin EC 81 MG tablet Take by mouth.    . calcium-vitamin D (OSCAL 500/200 D-3) 500-200 MG-UNIT tablet Take 3 tablets by mouth daily with breakfast. 90 tablet 5  . Deferiprone 500 MG TABS 5 tablets po twice a day 818 tablet 3  . folic acid (FOLVITE) 1 MG tablet Take by mouth.    . hydrALAZINE (APRESOLINE) 50 MG tablet     . isosorbide dinitrate (ISORDIL) 20 MG tablet Take by mouth.    . loperamide (IMODIUM) 2 MG capsule Take 1 capsule (2 mg total) by mouth every 4 (four) hours as needed for diarrhea or loose stools. 90 capsule 0  . metoprolol succinate (TOPROL-XL) 100 MG 24 hr tablet     . potassium chloride SA (K-DUR,KLOR-CON) 20 MEQ tablet Take 1 tablet (20 mEq total) by mouth daily. 90 tablet 1  . ranitidine (ZANTAC) 150 MG tablet Take by mouth.    . sodium bicarbonate 650 MG tablet Take by mouth.    . spironolactone (ALDACTONE) 25 MG tablet     . torsemide (DEMADEX) 20 MG tablet Take by mouth.    . levothyroxine (SYNTHROID, LEVOTHROID) 88 MCG tablet Take by mouth.    Marland Kitchen  pravastatin (PRAVACHOL) 40 MG tablet Take by mouth.     No current facility-administered medications for this visit.     PHYSICAL EXAMINATION: ECOG PERFORMANCE STATUS: 2 - Symptomatic, <50% confined to bed Vital signs reviewed in Epic GENERAL:alert, in no acute distress and comfortable SKIN: no acute rashes, no significant lesions EYES: conjunctiva are pink and non-injected, sclera anicteric OROPHARYNX: MMM, no exudates, no oropharyngeal erythema or ulceration NECK: supple, no JVD LYMPH:  no palpable lymphadenopathy in the cervical, axillary or inguinal regions LUNGS: clear to auscultation  b/l with normal respiratory effort HEART: regular rate & rhythm ABDOMEN:  normoactive bowel sounds , large splenomegaly. Extremity: no pedal edema PSYCH: alert & oriented x 3 with fluent speech NEURO: no focal motor/sensory deficits  LABORATORY DATA:   I have reviewed the data as listed  . CBC Latest Ref Rng & Units 07/31/2016 07/03/2016 05/29/2016  WBC 4.0 - 10.5 K/uL 5.9 4.5 6.1  Hemoglobin 13.0 - 17.0 g/dL 7.4(L) 6.1(LL) 7.4(L)  Hematocrit 39.0 - 52.0 % 23.4(L) 19.5(L) 22.7(L)  Platelets 150 - 400 K/uL 103(L) 71(L) 95(L)    . CMP Latest Ref Rng & Units 07/31/2016 07/03/2016 05/29/2016  Glucose 65 - 99 mg/dL 134(H) 109(H) 102(H)  BUN 6 - 20 mg/dL 96(H) 112(H) 106(H)  Creatinine 0.61 - 1.24 mg/dL 4.33(H) 3.96(H) 3.61(H)  Sodium 135 - 145 mmol/L 141 145 137  Potassium 3.5 - 5.1 mmol/L 4.6 4.4 5.4(H)  Chloride 101 - 111 mmol/L 113(H) 119(H) 113(H)  CO2 22 - 32 mmol/L 17(L) 16(L) 19(L)  Calcium 8.9 - 10.3 mg/dL 5.7(LL) 6.2(LL) 7.4(L)  Total Protein 6.5 - 8.1 g/dL 6.0(L) 5.9(L) 6.2(L)  Total Bilirubin 0.3 - 1.2 mg/dL 0.5 0.5 0.4  Alkaline Phos 38 - 126 U/L 76 78 103  AST 15 - 41 U/L 31 21 40  ALT 17 - 63 U/L 31 27 58     RADIOGRAPHIC STUDIES: I have personally reviewed the radiological images as listed and agreed with the findings in the report. No results found.  ASSESSMENT & PLAN:   76 year old gentleman with  1)Primary myelofibrosis ([place]) Chronic myelofibrosis, JAK2 POSITIVE with poor tolerance (according to records) to [facility].  Transfusion dependent requiring monthly transfusion and iron overloaded secondary repeated transfusions, on Deferiprone. 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 -His kidney function is  worsening. This along with history of CHF is going to make his transfusions increasingly challenging due to potential concerns with fluid overload.  -He is continuing his death therapy on at this time. -Cannot use Jakafi in the setting of his cytopenias -- poor tolerated previously -Questionable benefit from EPO therapy in the setting of PMF -Splenectomy is not an option due to his tenuous cardiac and renal condition as well as the concern that there will be liver failure due to extramedullary hematopoiesis. . Patient Active Problem List   Diagnosis Date Noted  . Thrombocytopenia ([place]) 01/23/2016  . Ascites 01/23/2016  . Diarrhea 12/26/2015  . Personal history of other medical treatment 11/25/2015  . History of inguinal hernia repair 11/25/2015  . Asthma without status asthmaticus 11/25/2015  . Primary myelofibrosis ([place]) 11/25/2015  . Iron overload due to repeated red blood cell transfusions 11/25/2015  . Metabolic acidosis with normal anion gap and bicarbonate losses 11/12/2015  . Absolute anemia 09/28/2015  .  Other specified cardiac arrhythmias 06/21/2015  . 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 Anemia in chronic illness 12/24/2014  . History of cardiac pacemaker in situ 10/15/2014  . 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 Pulmonary hypertension 10/14/2014  . 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 Enlargement of spleen 08/25/2014  . 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 Type 2 diabetes mellitus ([place]) 04/03/2013  . Chronic kidney disease 04/03/2013  . Arteriosclerosis of coronary artery 04/03/2013  . Cardiomyopathy ([place]) 10/03/2012  . Chronic  systolic heart failure ([place]) 10/03/2012  . 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 HLD (hyperlipidemia) 06/15/2012  . BP (high blood pressure) 06/15/2012  . Gout 06/15/2012  . Chronic kidney disease (CKD), stage III (moderate) 06/15/2012  . Congestive heart failure ([place]) 06/15/2012  . Atrial fibrillation ([place]) 12/24/2011  . Benign essential HTN 12/08/2010  . Disorder of function of stomach 12/08/2010   -In the setting of his multiple medical comorbidities cardiomyopathy worsening kidney function with metabolic acidosis and transfusion dependent myelofibrosis, and other things patient has a grave and rather poor prognosis. He needs to have an honest goals of care discussion with his primary care physician to define what he would like his care to look as -Might not be the best candidate for dialysis .  Return to clinic in 1 month for repeat lab draws and transfusion when necessary   return to clinic in 2 months with labs  I spent 20 minutes counseling the patient face to face. The total time spent in the appointment was 25 minutes and more than 50% was on counseling and direct patient cares.    Sullivan Lone MD [place] AAHIVMS [facility] Hematology/Oncology Physician [facility]  (Office):       [phone] (Work cell):  [phone] (Fax):           [phone]

## 2016-09-03 ENCOUNTER — Telehealth (HOSPITAL_COMMUNITY): Payer: Self-pay | Admitting: Emergency Medicine

## 2016-09-03 ENCOUNTER — Encounter (HOSPITAL_COMMUNITY): Payer: Medicare HMO | Attending: Oncology

## 2016-09-03 ENCOUNTER — Other Ambulatory Visit (HOSPITAL_COMMUNITY): Payer: Self-pay | Admitting: Oncology

## 2016-09-03 DIAGNOSIS — D649 Anemia, unspecified: Secondary | ICD-10-CM | POA: Insufficient documentation

## 2016-09-03 DIAGNOSIS — D471 Chronic myeloproliferative disease: Secondary | ICD-10-CM

## 2016-09-03 DIAGNOSIS — Z1589 Genetic susceptibility to other disease: Secondary | ICD-10-CM

## 2016-09-03 LAB — CBC WITH DIFFERENTIAL/PLATELET
BASOS ABS: 0 10*3/uL (ref 0.0–0.1)
BASOS PCT: 0 %
EOS ABS: 0 10*3/uL (ref 0.0–0.7)
EOS PCT: 0 %
HEMATOCRIT: 22.2 % — AB (ref 39.0–52.0)
Hemoglobin: 7.1 g/dL — ABNORMAL LOW (ref 13.0–17.0)
Lymphocytes Relative: 16 %
Lymphs Abs: 0.8 10*3/uL (ref 0.7–4.0)
MCH: 30.5 pg (ref 26.0–34.0)
MCHC: 32 g/dL (ref 30.0–36.0)
MCV: 95.3 fL (ref 78.0–100.0)
MONO ABS: 0 10*3/uL — AB (ref 0.1–1.0)
Monocytes Relative: 1 %
Neutro Abs: 4 10*3/uL (ref 1.7–7.7)
Neutrophils Relative %: 83 %
PLATELETS: 86 10*3/uL — AB (ref 150–400)
RBC: 2.33 MIL/uL — AB (ref 4.22–5.81)
RDW: 25.4 % — AB (ref 11.5–15.5)
WBC: 4.8 10*3/uL (ref 4.0–10.5)

## 2016-09-03 LAB — COMPREHENSIVE METABOLIC PANEL
ALBUMIN: 3.2 g/dL — AB (ref 3.5–5.0)
ALT: 29 U/L (ref 17–63)
ANION GAP: 8 (ref 5–15)
AST: 24 U/L (ref 15–41)
Alkaline Phosphatase: 74 U/L (ref 38–126)
BILIRUBIN TOTAL: 0.5 mg/dL (ref 0.3–1.2)
BUN: 105 mg/dL — ABNORMAL HIGH (ref 6–20)
CHLORIDE: 117 mmol/L — AB (ref 101–111)
CO2: 16 mmol/L — ABNORMAL LOW (ref 22–32)
Calcium: 7 mg/dL — ABNORMAL LOW (ref 8.9–10.3)
Creatinine, Ser: 4.19 mg/dL — ABNORMAL HIGH (ref 0.61–1.24)
GFR calc Af Amer: 15 mL/min — ABNORMAL LOW (ref >60)
GFR calc non Af Amer: 13 mL/min — ABNORMAL LOW (ref >60)
GLUCOSE: 100 mg/dL — AB (ref 65–99)
POTASSIUM: 5.7 mmol/L — AB (ref 3.5–5.1)
SODIUM: 141 mmol/L (ref 135–145)
TOTAL PROTEIN: 5.9 g/dL — AB (ref 6.5–8.1)

## 2016-09-03 LAB — PREPARE RBC (CROSSMATCH)

## 2016-09-03 MED ORDER — SODIUM POLYSTYRENE SULFONATE PO POWD: Freq: Once | ORAL | 0 refills | Status: AC

## 2016-09-03 MED ORDER — SODIUM POLYSTYRENE SULFONATE PO POWD: Freq: Once | ORAL | 0 refills | Status: DC

## 2016-09-04 ENCOUNTER — Encounter (HOSPITAL_BASED_OUTPATIENT_CLINIC_OR_DEPARTMENT_OTHER): Payer: Medicare HMO

## 2016-09-04 ENCOUNTER — Encounter (HOSPITAL_COMMUNITY): Payer: Self-pay | Admitting: Emergency Medicine

## 2016-09-04 ENCOUNTER — Encounter (HOSPITAL_COMMUNITY): Payer: Self-pay

## 2016-09-04 DIAGNOSIS — D649 Anemia, unspecified: Secondary | ICD-10-CM

## 2016-09-04 MED ORDER — HEPARIN SOD (PORK) LOCK FLUSH 100 UNIT/ML IV SOLN: 500.0000 [IU] | Freq: Every day | INTRAVENOUS | Status: DC | PRN

## 2016-09-04 MED ORDER — DIPHENHYDRAMINE HCL 25 MG PO CAPS
25.0000 mg | ORAL_CAPSULE | Freq: Once | ORAL | Status: AC
  Administered 2016-09-04: 25 mg via ORAL

## 2016-09-04 MED ORDER — ACETAMINOPHEN 325 MG PO TABS
650.0000 mg | ORAL_TABLET | Freq: Once | ORAL | Status: AC
  Administered 2016-09-04: 650 mg via ORAL

## 2016-09-04 MED ORDER — SODIUM CHLORIDE 0.9 % IV SOLN
250.0000 mL | Freq: Once | INTRAVENOUS | Status: AC
  Administered 2016-09-04: 250 mL via INTRAVENOUS

## 2016-09-04 NOTE — Progress Notes (Signed)
2 units of blood given today per orders. Patient tolerated it well, no issues. Vitals stable and discharged home from clinic via wheelchair with family member.follow up as scheduled.

## 2016-09-04 NOTE — Telephone Encounter (Signed)
Called pt with hemoglobin results.  He would like a blood transfusion.  Orders entered.  Blood bank (lauren) notified.  Prepare released.  Pts potassium also elevated.  Kayexalte sent into pharmacy for pt to take tonight.

## 2016-09-04 NOTE — Progress Notes (Signed)
Labs faxed to PCP Dr Dory Larsen at [phone]

## 2016-09-04 NOTE — Patient Instructions (Signed)
[facility] at [facility] Discharge Instructions  RECOMMENDATIONS MADE BY THE CONSULTANT AND ANY TEST RESULTS WILL BE SENT TO YOUR REFERRING PHYSICIAN.  2 units of blood given today. Follow up as scheduled.  Thank you for choosing [facility] at [facility] to provide your oncology and hematology care.  To afford each patient quality time with our provider, please arrive at least 15 minutes before your scheduled appointment time.    If you have a lab appointment with the Cancer Center please come in thru the  Main Entrance and check in at the main information desk  You need to re-schedule your appointment should you arrive 10 or more minutes late.  We strive to give you quality time with our providers, and arriving late affects you and other patients whose appointments are after yours.  Also, if you no show three or more times for appointments you may be dismissed from the clinic at the providers discretion.     Again, thank you for choosing [facility].  Our hope is that these requests will decrease the amount of time that you wait before being seen by our physicians.       _____________________________________________________________  Should you have questions after your visit to [facility], please contact our office at [phone] between the hours of 8:30 a.m. and 4:30 p.m.  Voicemails left after 4:30 p.m. will not be returned until the following business day.  For prescription refill requests, have your pharmacy contact our office.       Resources For Cancer Patients and their Caregivers ? American Cancer Society: Can assist with transportation, wigs, general needs, runs Look Good Feel Better.        1-888-227-6333 ? Cancer Care: Provides financial assistance, online support groups, medication/co-pay assistance.  1-800-813-HOPE (4673) ? [facility] Assists [place] cancer  patients and their families through emotional , educational and financial support.  336-427-4357 ? [place] DSS Where to apply for food stamps, Medicaid and utility assistance. 336-342-1394 ? [facility]: Transportation to medical appointments. 336-347-2287 ? Social Security Administration: May apply for disability if have a Stage IV cancer. 336-342-7796 1-800-772-1213 ? [place] Aging, Disability and Transit Services: Assists with nutrition, care and transit needs. 336-349-2343  Cancer Center Support Programs: @10RELATIVEDAYS@ > Cancer Support Group  2nd Tuesday of the month 1pm-2pm, Journey Room  > Creative Journey  3rd Tuesday of the month 1130am-1pm, Journey Room  > Look Good Feel Better  1st Wednesday of the month 10am-12 noon, Journey Room (Call American Cancer Society to register 1-800-395-5775)   

## 2016-09-05 LAB — TYPE AND SCREEN
ABO/RH(D): O POS
Antibody Screen: NEGATIVE
UNIT DIVISION: 0
UNIT DIVISION: 0

## 2016-09-05 LAB — BPAM RBC
Blood Product Expiration Date: 201805032359
Blood Product Expiration Date: 201805072359
ISSUE DATE / TIME: 201804241036
ISSUE DATE / TIME: 201804240841
UNIT TYPE AND RH: 5100
Unit Type and Rh: 5100

## 2016-10-02 ENCOUNTER — Ambulatory Visit (HOSPITAL_COMMUNITY): Payer: Medicare HMO

## 2016-10-02 ENCOUNTER — Other Ambulatory Visit (HOSPITAL_COMMUNITY): Payer: Medicare HMO

## 2016-10-12 ENCOUNTER — Other Ambulatory Visit (HOSPITAL_COMMUNITY): Payer: Medicare HMO

## 2016-10-12 ENCOUNTER — Ambulatory Visit (HOSPITAL_COMMUNITY): Payer: Medicare HMO

## 2016-11-02 ENCOUNTER — Telehealth (HOSPITAL_COMMUNITY): Payer: Self-pay

## 2016-11-02 NOTE — Telephone Encounter (Signed)
Lattie Haw, a pharmacist with [facility] [phone]), called because patient states he was taken off of the [facility]. She wanted to be sure of this. Explained to pharmacist that patient had not been seen here at [facility] since March 2018. However, looking in care everywhere, patient has been hospitalized in the [facility] and according to the note from 10/02/16 hospitalization, he has since been started on dialysis in addition to other health problems. The note from care everywhere concerning there [facility] states:   #Myelofibrosis . Patient receives routine transfusion monthly as outpatinet. Continue deferiprone bid for iron binder. Pharmacy raised concern that maybe has had nonadherence due to high # of pills in bottle filled from December. Patient reports he only takes 2 tabs (1000mg ) bid not 5 tabs as he thinks the higher dose was contributing to abd cramps and diarrhea. Hematology recommended hold iron binder until f/u primary hematologist.  Read this to [facility] and she states they will d/c patients ferriprox. If patient follows up and needs to start back on medication she said to just send a new prescription in.   Notified providers of above.

## 2017-03-26 ENCOUNTER — Encounter (HOSPITAL_COMMUNITY): Payer: Self-pay | Admitting: Emergency Medicine

## 2017-03-26 ENCOUNTER — Emergency Department (HOSPITAL_COMMUNITY)
Admission: EM | Admit: 2017-03-26 | Discharge: 2017-03-26 | Disposition: A | Discharge status: Home / Self Care | Payer: Medicare HMO | Attending: Emergency Medicine | Admitting: Emergency Medicine

## 2017-03-26 ENCOUNTER — Other Ambulatory Visit: Payer: Self-pay

## 2017-03-26 DIAGNOSIS — R531 Weakness: Secondary | ICD-10-CM | POA: Diagnosis present

## 2017-03-26 DIAGNOSIS — E039 Hypothyroidism, unspecified: Secondary | ICD-10-CM | POA: Insufficient documentation

## 2017-03-26 DIAGNOSIS — I5022 Chronic systolic (congestive) heart failure: Secondary | ICD-10-CM | POA: Insufficient documentation

## 2017-03-26 DIAGNOSIS — J45909 Unspecified asthma, uncomplicated: Secondary | ICD-10-CM | POA: Diagnosis not present

## 2017-03-26 DIAGNOSIS — D649 Anemia, unspecified: Secondary | ICD-10-CM

## 2017-03-26 DIAGNOSIS — Z79899 Other long term (current) drug therapy: Secondary | ICD-10-CM | POA: Diagnosis not present

## 2017-03-26 DIAGNOSIS — Z992 Dependence on renal dialysis: Secondary | ICD-10-CM | POA: Insufficient documentation

## 2017-03-26 DIAGNOSIS — I132 Hypertensive heart and chronic kidney disease with heart failure and with stage 5 chronic kidney disease, or end stage renal disease: Secondary | ICD-10-CM | POA: Diagnosis not present

## 2017-03-26 DIAGNOSIS — N186 End stage renal disease: Secondary | ICD-10-CM | POA: Diagnosis not present

## 2017-03-26 DIAGNOSIS — Z7982 Long term (current) use of aspirin: Secondary | ICD-10-CM | POA: Insufficient documentation

## 2017-03-26 DIAGNOSIS — Z87891 Personal history of nicotine dependence: Secondary | ICD-10-CM | POA: Diagnosis not present

## 2017-03-26 LAB — COMPREHENSIVE METABOLIC PANEL
ALBUMIN: 3.6 g/dL (ref 3.5–5.0)
ALT: 50 U/L (ref 17–63)
ANION GAP: 11 (ref 5–15)
AST: 48 U/L — ABNORMAL HIGH (ref 15–41)
Alkaline Phosphatase: 85 U/L (ref 38–126)
BILIRUBIN TOTAL: 0.6 mg/dL (ref 0.3–1.2)
BUN: 51 mg/dL — ABNORMAL HIGH (ref 6–20)
CO2: 32 mmol/L (ref 22–32)
Calcium: 8 mg/dL — ABNORMAL LOW (ref 8.9–10.3)
Chloride: 97 mmol/L — ABNORMAL LOW (ref 101–111)
Creatinine, Ser: 6.87 mg/dL — ABNORMAL HIGH (ref 0.61–1.24)
GFR calc Af Amer: 8 mL/min — ABNORMAL LOW (ref >60)
GFR calc non Af Amer: 7 mL/min — ABNORMAL LOW (ref >60)
GLUCOSE: 130 mg/dL — AB (ref 65–99)
POTASSIUM: 2.9 mmol/L — AB (ref 3.5–5.1)
Sodium: 140 mmol/L (ref 135–145)
TOTAL PROTEIN: 6.9 g/dL (ref 6.5–8.1)

## 2017-03-26 LAB — CBC WITH DIFFERENTIAL/PLATELET
BASOS PCT: 0 %
Basophils Absolute: 0 10*3/uL (ref 0.0–0.1)
Eosinophils Absolute: 0 10*3/uL (ref 0.0–0.7)
Eosinophils Relative: 2 %
HEMATOCRIT: 19.5 % — AB (ref 39.0–52.0)
HEMOGLOBIN: 6.5 g/dL — AB (ref 13.0–17.0)
LYMPHS ABS: 0.5 10*3/uL — AB (ref 0.7–4.0)
LYMPHS PCT: 19 %
MCH: 32.7 pg (ref 26.0–34.0)
MCHC: 33.3 g/dL (ref 30.0–36.0)
MCV: 98 fL (ref 78.0–100.0)
MONOS PCT: 0 %
Monocytes Absolute: 0 10*3/uL — ABNORMAL LOW (ref 0.1–1.0)
NEUTROS ABS: 2.2 10*3/uL (ref 1.7–7.7)
NEUTROS PCT: 80 %
Platelets: 86 10*3/uL — ABNORMAL LOW (ref 150–400)
RBC: 1.99 MIL/uL — ABNORMAL LOW (ref 4.22–5.81)
RDW: 20.4 % — ABNORMAL HIGH (ref 11.5–15.5)
WBC: 2.7 10*3/uL — ABNORMAL LOW (ref 4.0–10.5)

## 2017-03-26 LAB — PREPARE RBC (CROSSMATCH)

## 2017-03-26 MED ORDER — POTASSIUM CHLORIDE CRYS ER 20 MEQ PO TBCR
40.0000 meq | EXTENDED_RELEASE_TABLET | Freq: Once | ORAL | Status: AC
  Administered 2017-03-26: 40 meq via ORAL
  Filled 2017-03-26: qty 2

## 2017-03-26 MED ORDER — SODIUM CHLORIDE 0.9 % IV SOLN: Freq: Once | INTRAVENOUS | Status: DC

## 2017-03-26 NOTE — ED Notes (Signed)
Pt agitated about the amount of time he has been waiting here today. Pt made aware we had been waiting on blood results and that we have only one doctor. He requested water to drink. Pt less agitated now and is calm.

## 2017-03-26 NOTE — ED Triage Notes (Signed)
Pt c/o generalized weakness/dizziness x a few days. Pt reports history of anemia and states he feels like he needs blood. Dialysis pt-last yesterday.

## 2017-03-26 NOTE — ED Notes (Signed)
Rate increased to 150cc/hr

## 2017-03-26 NOTE — Discharge Instructions (Signed)
You received 1 unit of blood today.  Follow-up with your kidney specialist.

## 2017-03-26 NOTE — ED Notes (Signed)
CRITICAL VALUE ALERT  Critical Value:  Hemoglobin 6.5  Date & Time Notied:  03/26/17  Provider Notified: Lacinda Axon  Orders Received/Actions taken: EDP made aware

## 2017-03-26 NOTE — ED Provider Notes (Signed)
[facility] EMERGENCY DEPARTMENT Provider Note   CSN: [id number] Arrival date & time: 03/26/17  0741     History   Chief Complaint Chief Complaint  Patient presents with  . Weakness    HPI Nicolas Gibson is a 76 y.o. male.  Weakness and dizziness for a few days.  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  He has required multiple transfusions in the past for anemia.  He feels like he needs blood replacement.  No chest pain, dyspnea, fever, sweats, chills.  Severity of symptoms is mild to moderate.      Past Medical History:  Diagnosis Date  . Anemia    He said teh cause has not been determined. His last transfusion requirement was about 4 years ago.  Marland Kitchen History of gout   . Hypertension   . Paroxysmal atrial fibrillation (HCC)   . 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 Thrombocytopenia ([place]) 01/23/2016  . Ascites 01/23/2016  . Diarrhea 12/26/2015  . Personal history of other medical treatment 11/25/2015  . History of inguinal hernia repair 11/25/2015  . Asthma without status asthmaticus 11/25/2015  . 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 Iron overload due to repeated red blood cell transfusions 11/25/2015  . Metabolic acidosis with normal anion gap and bicarbonate losses 11/12/2015  . Absolute anemia 09/28/2015  . Other specified cardiac arrhythmias 06/21/2015  . 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 Anemia in chronic illness 12/24/2014  . History of cardiac pacemaker in situ 10/15/2014  . 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 Pulmonary hypertension ([place]) 10/14/2014  . 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 Enlargement of spleen 08/25/2014  . 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 Type 2 diabetes mellitus ([place]) 04/03/2013  . Chronic kidney disease 04/03/2013  . Arteriosclerosis of coronary artery 04/03/2013  . Cardiomyopathy ([place]) 10/03/2012  . Chronic systolic heart failure ([place]) 10/03/2012  . Chronic kidney disease, stage IV (severe) ([place]) 10/03/2012  . 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 HLD (hyperlipidemia) 06/15/2012  . BP (high blood pressure) 06/15/2012  . Gout 06/15/2012  . Chronic kidney disease (CKD), stage III (moderate) ([place]) 06/15/2012  . Congestive heart failure ([place]) 06/15/2012  . Atrial fibrillation ([place]) 12/24/2011  . Benign essential HTN 12/08/2010  . Disorder of function of stomach 12/08/2010    Past Surgical History:  Procedure Laterality Date  . Right inguinal hernia repair         Home Medications    Prior to Admission medications   Medication Sig Start Date End Date Taking? Authorizing Provider  acetaminophen (TYLENOL) 500 MG tablet Take 2 tablets daily as needed by mouth.    [provider]  allopurinol (ZYLOPRIM) 100 MG tablet Take by mouth. 11/15/15 11/14/16  [provider]  allopurinol (ZYLOPRIM) 100 MG tablet Take 1 tablet daily by mouth. 03/25/17   [provider]  aspirin EC 81 MG tablet Take by mouth.    [provider]  calcium acetate (PHOSLO) 667 MG capsule Take 1 capsule 4 (four) times daily by mouth. 02/11/17   [provider]  calcium-vitamin D (OSCAL 500/200 D-3) 500-200 MG-UNIT tablet Take 3 tablets by mouth  daily with breakfast. 07/03/16   Baird Cancer, PA-C  Deferiprone 500 MG TABS 5 tablets po twice a day 01/25/16   Baird Cancer, PA-C  folic acid (FOLVITE) 1 MG tablet Take by mouth. 06/28/16 06/28/17  [provider]  hydrALAZINE (APRESOLINE) 50 MG tablet  03/28/15   [provider]  isosorbide dinitrate (ISORDIL) 20 MG tablet Take by mouth. 11/01/15 11/01/16  [provider]  levothyroxine (SYNTHROID, LEVOTHROID) 88 MCG tablet Take by mouth. 04/14/15 04/13/16  [provider]  loperamide (IMODIUM) 2 MG capsule Take 1 capsule (2 mg total) by mouth every 4 (four) hours as needed for diarrhea or loose stools. 04/02/16   Baird Cancer, PA-C  losartan (COZAAR) 50 MG tablet Take 1 tablet daily by mouth. 03/10/17   [provider]  metoprolol succinate (TOPROL-XL) 100 MG 24 hr tablet  09/09/15   [provider]  potassium chloride SA (K-DUR,KLOR-CON) 20 MEQ tablet Take 1 tablet (20 mEq total) by mouth daily. 06/20/16   Baird Cancer, PA-C  pravastatin (PRAVACHOL) 40 MG tablet Take by mouth. 04/14/15 04/13/16  [provider]  predniSONE (DELTASONE) 50 MG tablet Take 1 tablet as needed by mouth.    [provider]  ranitidine (ZANTAC) 150 MG tablet Take by mouth.    [provider]  spironolactone (ALDACTONE) 25 MG tablet  06/18/16   [provider]  torsemide (DEMADEX) 20 MG tablet Take by mouth. 10/29/15   [provider]    Family History Family History  Problem Relation Age of Onset  . Heart failure Mother   . Hypertension Mother   . Kidney failure Father   . Hypertension Father   . Diabetes Brother   . Hypertension Brother     Social History Social History   Tobacco Use  . Smoking status: Former Smoker    Types: Cigarettes    Last attempt to quit: 05/14/1981    Years since quitting: 35.8  . Smokeless tobacco: Never Used  Substance Use Topics  . Alcohol use: No  . Drug use: No     Allergies   Lisinopril   Review of Systems Review of Systems  All other systems reviewed and are negative.    Physical Exam Updated Vital Signs BP (!) 112/50 (BP Location: Right Arm)   Pulse 62 Comment: Manually palpated  Temp 98.2 F (36.8 C) (Oral)   Resp 18   Ht 6\' 1"   (1.854 m)   Wt 79.8 kg (176 lb)   SpO2 100%   BMI 23.22 kg/m   Physical Exam  Constitutional: He is oriented to person, place, and time. He appears well-developed and well-nourished.  HENT:  Head: Normocephalic and atraumatic.  Eyes: Conjunctivae are normal.  Neck: Neck supple.  Cardiovascular: Normal rate and regular rhythm.  Pulmonary/Chest: Effort normal and breath sounds normal.  Abdominal: Soft. Bowel sounds are normal.  Musculoskeletal: Normal range of motion.  Neurological: He is alert and oriented to person, place, and time.  Skin: Skin is warm and dry.  Psychiatric: He has a normal mood and affect. His behavior is normal.  Nursing note and vitals reviewed.    ED Treatments / Results  Labs (all labs ordered are listed, but only abnormal results are displayed) Labs Reviewed  CBC WITH DIFFERENTIAL/PLATELET - Abnormal; Notable for the following components:      Result Value   WBC 2.7 (*)    RBC 1.99 (*)    Hemoglobin 6.5 (*)  HCT 19.5 (*)    RDW 20.4 (*)    Platelets 86 (*)    Lymphs Abs 0.5 (*)    Monocytes Absolute 0.0 (*)    All other components within normal limits  COMPREHENSIVE METABOLIC PANEL - Abnormal; Notable for the following components:   Potassium 2.9 (*)    Chloride 97 (*)    Glucose, Bld 130 (*)    BUN 51 (*)    Creatinine, Ser 6.87 (*)    Calcium 8.0 (*)    AST 48 (*)    GFR calc non Af Amer 7 (*)    GFR calc Af Amer 8 (*)    All other components within normal limits  TYPE AND SCREEN  PREPARE RBC (CROSSMATCH)    EKG  EKG Interpretation  Date/Time:  Tuesday March 26 2017 07:58:36 EST Ventricular Rate:  50 PR Interval:    QRS Duration: 150 QT Interval:  631 QTC Calculation: 576 R Axis:   -78 Text Interpretation:  Ventricular-paced rhythm No further analysis attempted due to paced rhythm Confirmed by Nat Christen [phone]) on 03/26/2017 8:11:44 AM       Radiology No results found.  Procedures Procedures (including critical  care time)  Medications Ordered in ED Medications  0.9 %  sodium chloride infusion (not administered)     Initial Impression / Assessment and Plan / ED Course  I have reviewed the triage vital signs and the nursing notes.  Pertinent labs & imaging results that were available during my care of the patient were reviewed by me and considered in my medical decision making (see chart for details).     Patient is in no acute distress.  Hemoglobin is 6.5.  Potassium 2.9.  Will replace potassium orally and give 1 unit of packed cells.  Final Clinical Impressions(s) / ED Diagnoses   Final diagnoses:  ESRD (end stage renal disease) ([place])  Anemia, unspecified type    ED Discharge Orders    None       Nat Christen, MD 03/26/17 1453

## 2017-03-27 LAB — BPAM RBC
BLOOD PRODUCT EXPIRATION DATE: 201812062359
ISSUE DATE / TIME: 201811131358
UNIT TYPE AND RH: 5100

## 2017-03-27 LAB — TYPE AND SCREEN
ABO/RH(D): O POS
Antibody Screen: NEGATIVE
Unit division: 0

## 2017-04-08 ENCOUNTER — Other Ambulatory Visit (HOSPITAL_COMMUNITY): Payer: Self-pay | Admitting: *Deleted

## 2017-04-08 DIAGNOSIS — D471 Chronic myeloproliferative disease: Secondary | ICD-10-CM

## 2017-04-09 ENCOUNTER — Encounter (HOSPITAL_COMMUNITY): Payer: Self-pay | Admitting: Oncology

## 2017-04-09 ENCOUNTER — Other Ambulatory Visit: Payer: Self-pay

## 2017-04-09 ENCOUNTER — Encounter (HOSPITAL_COMMUNITY): Payer: Medicare HMO | Attending: Oncology

## 2017-04-09 ENCOUNTER — Encounter (HOSPITAL_BASED_OUTPATIENT_CLINIC_OR_DEPARTMENT_OTHER): Payer: Medicare HMO | Admitting: Oncology

## 2017-04-09 VITALS — BP 84/41 | HR 52 | Temp 98.1°F | Resp 16 | Ht 73.0 in | Wt 180.6 lb

## 2017-04-09 DIAGNOSIS — I5042 Chronic combined systolic (congestive) and diastolic (congestive) heart failure: Secondary | ICD-10-CM | POA: Insufficient documentation

## 2017-04-09 DIAGNOSIS — D7581 Myelofibrosis: Secondary | ICD-10-CM | POA: Diagnosis not present

## 2017-04-09 DIAGNOSIS — D72819 Decreased white blood cell count, unspecified: Secondary | ICD-10-CM | POA: Diagnosis not present

## 2017-04-09 DIAGNOSIS — E1122 Type 2 diabetes mellitus with diabetic chronic kidney disease: Secondary | ICD-10-CM | POA: Insufficient documentation

## 2017-04-09 DIAGNOSIS — D649 Anemia, unspecified: Secondary | ICD-10-CM

## 2017-04-09 DIAGNOSIS — R161 Splenomegaly, not elsewhere classified: Secondary | ICD-10-CM | POA: Diagnosis not present

## 2017-04-09 DIAGNOSIS — N184 Chronic kidney disease, stage 4 (severe): Secondary | ICD-10-CM | POA: Insufficient documentation

## 2017-04-09 DIAGNOSIS — D72829 Elevated white blood cell count, unspecified: Secondary | ICD-10-CM

## 2017-04-09 DIAGNOSIS — Z833 Family history of diabetes mellitus: Secondary | ICD-10-CM | POA: Diagnosis not present

## 2017-04-09 DIAGNOSIS — D696 Thrombocytopenia, unspecified: Secondary | ICD-10-CM

## 2017-04-09 DIAGNOSIS — D638 Anemia in other chronic diseases classified elsewhere: Secondary | ICD-10-CM | POA: Diagnosis not present

## 2017-04-09 DIAGNOSIS — D6959 Other secondary thrombocytopenia: Secondary | ICD-10-CM | POA: Diagnosis not present

## 2017-04-09 DIAGNOSIS — Z992 Dependence on renal dialysis: Secondary | ICD-10-CM | POA: Diagnosis not present

## 2017-04-09 DIAGNOSIS — D471 Chronic myeloproliferative disease: Secondary | ICD-10-CM | POA: Insufficient documentation

## 2017-04-09 DIAGNOSIS — D731 Hypersplenism: Secondary | ICD-10-CM

## 2017-04-09 DIAGNOSIS — Z8249 Family history of ischemic heart disease and other diseases of the circulatory system: Secondary | ICD-10-CM | POA: Diagnosis not present

## 2017-04-09 DIAGNOSIS — I130 Hypertensive heart and chronic kidney disease with heart failure and stage 1 through stage 4 chronic kidney disease, or unspecified chronic kidney disease: Secondary | ICD-10-CM | POA: Diagnosis not present

## 2017-04-09 DIAGNOSIS — D631 Anemia in chronic kidney disease: Secondary | ICD-10-CM | POA: Diagnosis not present

## 2017-04-09 DIAGNOSIS — Z87891 Personal history of nicotine dependence: Secondary | ICD-10-CM | POA: Diagnosis not present

## 2017-04-09 LAB — COMPREHENSIVE METABOLIC PANEL
ALBUMIN: 3.5 g/dL (ref 3.5–5.0)
ALK PHOS: 97 U/L (ref 38–126)
ALT: 89 U/L — ABNORMAL HIGH (ref 17–63)
ANION GAP: 13 (ref 5–15)
AST: 81 U/L — ABNORMAL HIGH (ref 15–41)
BUN: 65 mg/dL — ABNORMAL HIGH (ref 6–20)
CHLORIDE: 99 mmol/L — AB (ref 101–111)
CO2: 29 mmol/L (ref 22–32)
Calcium: 8 mg/dL — ABNORMAL LOW (ref 8.9–10.3)
Creatinine, Ser: 8.54 mg/dL — ABNORMAL HIGH (ref 0.61–1.24)
GFR calc Af Amer: 6 mL/min — ABNORMAL LOW (ref >60)
GFR calc non Af Amer: 5 mL/min — ABNORMAL LOW (ref >60)
GLUCOSE: 109 mg/dL — AB (ref 65–99)
Potassium: 3.9 mmol/L (ref 3.5–5.1)
SODIUM: 141 mmol/L (ref 135–145)
Total Bilirubin: 0.6 mg/dL (ref 0.3–1.2)
Total Protein: 7.2 g/dL (ref 6.5–8.1)

## 2017-04-09 LAB — CBC WITH DIFFERENTIAL/PLATELET
Basophils Absolute: 0 10*3/uL (ref 0.0–0.1)
Basophils Relative: 0 %
EOS ABS: 0 10*3/uL (ref 0.0–0.7)
EOS PCT: 1 %
HEMATOCRIT: 19.4 % — AB (ref 39.0–52.0)
Hemoglobin: 6.1 g/dL — CL (ref 13.0–17.0)
LYMPHS ABS: 0.6 10*3/uL — AB (ref 0.7–4.0)
LYMPHS PCT: 19 %
MCH: 31.8 pg (ref 26.0–34.0)
MCHC: 31.4 g/dL (ref 30.0–36.0)
MCV: 101 fL — AB (ref 78.0–100.0)
MONOS PCT: 4 %
Monocytes Absolute: 0.1 10*3/uL (ref 0.1–1.0)
Neutro Abs: 2.4 10*3/uL (ref 1.7–7.7)
Neutrophils Relative %: 75 %
PLATELETS: 116 10*3/uL — AB (ref 150–400)
RBC: 1.92 MIL/uL — AB (ref 4.22–5.81)
RDW: 21.2 % — ABNORMAL HIGH (ref 11.5–15.5)
WBC: 3.2 10*3/uL — AB (ref 4.0–10.5)

## 2017-04-09 LAB — PREPARE RBC (CROSSMATCH)

## 2017-04-09 NOTE — Progress Notes (Signed)
[facility]:    Nicolas Morel, MD [address] [place] [address]   DIAGNOSIS: Myelofibrosis  SUMMARY OF ONCOLOGIC HISTORY:   Primary myelofibrosis ([place])   06/27/2012 Initial Diagnosis    Primary myelofibrosis ([place])      12/08/2015 Imaging    Abdominal Ultrasound shows interval increase in the volume of the spleen which now measures 1806 cc. 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 Increased renal cortical echotexture bilaterally. Cystic structures in the right kidney with an upper pole complex appearing structure not well characterized. Normal appearance of the gallbladder and liver and pancreas. Trace of ascites adjacent to the liver.        CURRENT THERAPY: PRN blood transfusions  INTERVAL HISTORY: Nicolas Gibson 76 y.o. male returns for follow up of his myelofibrosis. He has been lost to follow up and not seen since March 2018. Last hemoglobin 2 weeks ago was 6.5 g/dL, he went to the ED and was given pRBC transfusion.  He started HD in May 2018 and goes for dialysis M/W/F. He has been getting tranfusion orders through his nephrologist but his nephrologist stated he will no longer be ordering his pRBC transfusions. He has been taken off of his iron chelator and has not taken it for months now. Hemoglobin is 6.1 g/dL today. Patient feels very fatigued. He denies any chest pain, shortness of breath, abdominal pain, N/V/D, focal weakness.    Patient Active Problem List   Diagnosis Date Noted  . Thrombocytopenia ([place]) 01/23/2016  . Ascites 01/23/2016  . Diarrhea 12/26/2015  . Personal history of other medical treatment 11/25/2015  . History of inguinal hernia repair 11/25/2015  . Asthma without status asthmaticus 11/25/2015  . Primary myelofibrosis ([place]) 11/25/2015  . Iron overload due to repeated red blood cell transfusions 11/25/2015  . Metabolic acidosis with normal anion gap and bicarbonate losses 11/12/2015  . Absolute anemia  09/28/2015  . Other specified cardiac arrhythmias 06/21/2015  . 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 Anemia in chronic illness 12/24/2014  . History of cardiac pacemaker in situ 10/15/2014  . 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 Pulmonary hypertension ([place]) 10/14/2014  . 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 Enlargement of spleen 08/25/2014  . 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 Type 2 diabetes mellitus ([place]) 04/03/2013  . Chronic kidney disease 04/03/2013  . Arteriosclerosis of coronary artery 04/03/2013  . Cardiomyopathy ([place]) 10/03/2012  . Chronic systolic heart failure ([place]) 10/03/2012  . Chronic kidney disease, stage IV (severe) ([place]) 10/03/2012  . 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 HLD (hyperlipidemia) 06/15/2012  . BP (high blood pressure) 06/15/2012  . Gout 06/15/2012  . Chronic kidney disease (CKD), stage III (moderate) ([place]) 06/15/2012  . Congestive heart failure ([place]) 06/15/2012  . Atrial fibrillation ([place]) 12/24/2011  . Benign essential HTN 12/08/2010  . Disorder of function of stomach 12/08/2010    is allergic to lisinopril.  MEDICAL HISTORY: Past Medical History:  Diagnosis Date  . Anemia    He said teh cause has not been determined. His last transfusion requirement was about 4 years ago.  Marland Kitchen History of gout   . Hypertension   . Paroxysmal atrial fibrillation (HCC)   . 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 Right inguinal hernia repair      SOCIAL HISTORY: Social History   Socioeconomic History  . Marital status: Married     Spouse name: Not on file  . Number of children: 1  . Years of education: Not on file  . Highest education level: Not on file  Social Needs  . Financial resource strain: Not on file  . Food insecurity - worry: Not on file  . Food insecurity - inability: Not on file  . Transportation needs - medical: Not on file  . Transportation needs - non-medical: Not on file  Occupational History  . Occupation: Retired  Tobacco Use  . Smoking status: Former Smoker    Types: Cigarettes    Last attempt to quit: 05/14/1981    Years since quitting: 35.9  . Smokeless tobacco: Never Used  Substance and Sexual Activity  . Alcohol use: No  . Drug use: No  . Sexual activity: Not on file  Other Topics Concern  . Not on file  Social History Narrative  . Not on file    FAMILY HISTORY: Family History  Problem Relation Age of Onset  . Heart failure Mother   . Hypertension Mother   . Kidney failure Father   . Hypertension Father   . Diabetes Brother   . Hypertension Brother     Review of Systems - Oncology  ROS as per HPI otherwise 12 point ROS is negative.   PHYSICAL EXAMINATION   Vitals:   04/09/17 1547  BP: (!) 84/41  Pulse: (!) 52  Resp: 16  Temp: 98.1 F (36.7 C)  SpO2: 100%    Physical Exam Constitutional: Elderly male in no distress.   HENT:  Head: Normocephalic and atraumatic.  Mouth/Throat: No oropharyngeal exudate. Mucosa moist. Eyes: Pupils are equal, round, and reactive to light. Conjunctivae are normal. No scleral icterus.  Neck: Normal range of motion. Neck supple. No JVD present.  Cardiovascular: Normal rate, regular rhythm and normal heart sounds.  Exam reveals no gallop and no friction rub.   No murmur heard. Pulmonary/Chest: Effort normal and breath sounds normal. No respiratory distress. No wheezes.No rales.  Abdominal: Soft. Bowel sounds are normal. No distension. There is no tenderness. There is no guarding.  Musculoskeletal: No edema or tenderness.   Lymphadenopathy:    No cervical or supraclavicular adenopathy.  Neurological: Alert and oriented to person, place, and time. No cranial nerve deficit.  Skin: Skin is warm and dry. No rash noted. No erythema. No pallor.  Psychiatric: Affect and judgment normal.   LABORATORY DATA:  CBC    Component Value Date/Time   WBC 3.2 (L) 04/09/2017 1501   RBC 1.92 (L) 04/09/2017 1501   HGB 6.1 (LL) 04/09/2017 1501   HCT 19.4 (L) 04/09/2017 1501   PLT 116 (L) 04/09/2017 1501   MCV 101.0 (H) 04/09/2017 1501   MCH 31.8 04/09/2017 1501   MCHC 31.4 04/09/2017 1501   RDW 21.2 (H) 04/09/2017 1501   LYMPHSABS 0.6 (L) 04/09/2017 1501   MONOABS 0.1 04/09/2017 1501   EOSABS 0.0 04/09/2017 1501   BASOSABS 0.0 04/09/2017 1501    CMP     Component Value Date/Time   NA 141 04/09/2017 1501   K 3.9 04/09/2017 1501   CL 99 (L) 04/09/2017 1501   CO2 29 04/09/2017 1501   GLUCOSE 109 (H) 04/09/2017 1501   BUN 65 (H) 04/09/2017 1501   CREATININE 8.54 (H) 04/09/2017 1501  CALCIUM 8.0 (L) 04/09/2017 1501   PROT 7.2 04/09/2017 1501   ALBUMIN 3.5 04/09/2017 1501   AST 81 (H) 04/09/2017 1501   ALT 89 (H) 04/09/2017 1501   ALKPHOS 97 04/09/2017 1501   BILITOT 0.6 04/09/2017 1501   GFRNONAA 5 (L) 04/09/2017 1501   GFRAA 6 (L) 04/09/2017 1501       ASSESSMENT and THERAPY PLAN:   1)Primary myelofibrosis (HCC) Chronic myelofibrosis, JAK2 POSITIVE with poor tolerance (according to records) to [facility]. Transfusion dependent and iron overloaded secondary repeated transfusions, was on deferiprone, but now off of it.  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 Concern about iron overload and the development of RBC antibodies, will become an issue in the future. If patient gets to the point where he is needing weekly pRBC transfusions, then will need to discuss about possible hospice and stopping supportive care for his PMF. -He is not a candidate for a bone marrow transplant which is the only curative option for PMF. -Intolerant to [facility] in the past. -Hemoglobin 6.1 g/dL, plan to get him set up for 2 units PRBC transfusion.  -RTC in 6 weeks for follow up. -I have placed standing orders for CBC q2 weeks and iron studies q4 weeks.   Orders Placed This Encounter  Procedures  . CBC with Differential    Standing Status:   Standing    Number of Occurrences:   10    Standing Expiration Date:   04/09/2018  . Iron and TIBC    Standing Status:   Standing    Number of Occurrences:   5    Standing Expiration Date:   04/09/2018  . Ferritin    Standing Status:   Standing    Number of Occurrences:   5    Standing Expiration Date:   04/09/2018      All questions were answered. The patient knows to call the clinic with any problems, questions or concerns. We can certainly see the patient much sooner if necessary. This note was electronically signed.  Twana First, MD 04/09/2017

## 2017-04-10 ENCOUNTER — Encounter (HOSPITAL_BASED_OUTPATIENT_CLINIC_OR_DEPARTMENT_OTHER): Payer: Medicare HMO

## 2017-04-10 ENCOUNTER — Other Ambulatory Visit: Payer: Self-pay

## 2017-04-10 DIAGNOSIS — D471 Chronic myeloproliferative disease: Secondary | ICD-10-CM | POA: Diagnosis not present

## 2017-04-10 DIAGNOSIS — D649 Anemia, unspecified: Secondary | ICD-10-CM

## 2017-04-10 MED ORDER — DIPHENHYDRAMINE HCL 25 MG PO CAPS
25.0000 mg | ORAL_CAPSULE | Freq: Once | ORAL | Status: AC
  Administered 2017-04-10: 25 mg via ORAL

## 2017-04-10 MED ORDER — ACETAMINOPHEN 325 MG PO TABS
650.0000 mg | ORAL_TABLET | Freq: Once | ORAL | Status: AC
  Administered 2017-04-10: 650 mg via ORAL

## 2017-04-10 MED ORDER — SODIUM CHLORIDE 0.9 % IV SOLN
250.0000 mL | Freq: Once | INTRAVENOUS | Status: AC
  Administered 2017-04-10: 250 mL via INTRAVENOUS

## 2017-04-10 NOTE — Progress Notes (Signed)
2 units of blood given today per orders. Patient tolerated it well.   Treatment given per orders. Patient tolerated it well without problems. Vitals stable and discharged home from clinic via wheelchair. Follow up as scheduled.

## 2017-04-10 NOTE — Patient Instructions (Signed)
[facility] at [facility] Discharge Instructions  RECOMMENDATIONS MADE BY THE CONSULTANT AND ANY TEST RESULTS WILL BE SENT TO YOUR REFERRING PHYSICIAN.  2 units of blood given today. Follow up as scheduled.  Thank you for choosing [facility] at [facility] to provide your oncology and hematology care.  To afford each patient quality time with our provider, please arrive at least 15 minutes before your scheduled appointment time.    If you have a lab appointment with the Cancer Center please come in thru the  Main Entrance and check in at the main information desk  You need to re-schedule your appointment should you arrive 10 or more minutes late.  We strive to give you quality time with our providers, and arriving late affects you and other patients whose appointments are after yours.  Also, if you no show three or more times for appointments you may be dismissed from the clinic at the providers discretion.     Again, thank you for choosing [facility].  Our hope is that these requests will decrease the amount of time that you wait before being seen by our physicians.       _____________________________________________________________  Should you have questions after your visit to  Cancer Center, please contact our office at [phone] between the hours of 8:30 a.m. and 4:30 p.m.  Voicemails left after 4:30 p.m. will not be returned until the following business day.  For prescription refill requests, have your pharmacy contact our office.       Resources For Cancer Patients and their Caregivers ? American Cancer Society: Can assist with transportation, wigs, general needs, runs Look Good Feel Better.        1-888-227-6333 ? Cancer Care: Provides financial assistance, online support groups, medication/co-pay assistance.  1-800-813-HOPE (4673) ? [facility] Assists [place] cancer  patients and their families through emotional , educational and financial support.  336-427-4357 ? [place] DSS Where to apply for food stamps, Medicaid and utility assistance. 336-342-1394 ? [facility]: Transportation to medical appointments. 336-347-2287 ? Social Security Administration: May apply for disability if have a Stage IV cancer. 336-342-7796 1-800-772-1213 ? [place] Aging, Disability and Transit Services: Assists with nutrition, care and transit needs. 336-349-2343  Cancer Center Support Programs: @[email or web address]@ > Cancer Support Group  2nd Tuesday of the month 1pm-2pm, Journey Room  > Creative Journey  3rd Tuesday of the month 1130am-1pm, Journey Room  > Look Good Feel Better  1st Wednesday of the month 10am-12 noon, Journey Room (Call American Cancer Society to register 1-800-395-5775)   

## 2017-04-11 LAB — BPAM RBC
Blood Product Expiration Date: 201812122359
Blood Product Expiration Date: 201812122359
ISSUE DATE / TIME: 201811280900
ISSUE DATE / TIME: 201811281105
Unit Type and Rh: 5100
Unit Type and Rh: 5100

## 2017-04-11 LAB — TYPE AND SCREEN
ABO/RH(D): O POS
Antibody Screen: NEGATIVE
Unit division: 0
Unit division: 0

## 2017-04-24 ENCOUNTER — Other Ambulatory Visit (HOSPITAL_COMMUNITY): Payer: Medicare HMO

## 2017-04-25 ENCOUNTER — Encounter (HOSPITAL_COMMUNITY): Payer: Medicare HMO | Attending: Oncology

## 2017-04-25 ENCOUNTER — Other Ambulatory Visit (HOSPITAL_COMMUNITY): Payer: Self-pay

## 2017-04-25 ENCOUNTER — Other Ambulatory Visit (HOSPITAL_COMMUNITY): Payer: Medicare HMO

## 2017-04-25 DIAGNOSIS — D471 Chronic myeloproliferative disease: Secondary | ICD-10-CM

## 2017-04-25 LAB — IRON AND TIBC
Iron: 145 ug/dL (ref 45–182)
SATURATION RATIOS: 68 % — AB (ref 17.9–39.5)
TIBC: 213 ug/dL — ABNORMAL LOW (ref 250–450)
UIBC: 68 ug/dL

## 2017-04-25 LAB — CBC WITH DIFFERENTIAL/PLATELET
Basophils Absolute: 0 10*3/uL (ref 0.0–0.1)
Basophils Relative: 0 %
Eosinophils Absolute: 0 10*3/uL (ref 0.0–0.7)
Eosinophils Relative: 1 %
HEMATOCRIT: 22.5 % — AB (ref 39.0–52.0)
Hemoglobin: 7.1 g/dL — ABNORMAL LOW (ref 13.0–17.0)
LYMPHS PCT: 14 %
Lymphs Abs: 0.6 10*3/uL — ABNORMAL LOW (ref 0.7–4.0)
MCH: 31.4 pg (ref 26.0–34.0)
MCHC: 31.6 g/dL (ref 30.0–36.0)
MCV: 99.6 fL (ref 78.0–100.0)
MONO ABS: 0.2 10*3/uL (ref 0.1–1.0)
MONOS PCT: 5 %
NEUTROS ABS: 3.8 10*3/uL (ref 1.7–7.7)
Neutrophils Relative %: 81 %
Platelets: 91 10*3/uL — ABNORMAL LOW (ref 150–400)
RBC: 2.26 MIL/uL — ABNORMAL LOW (ref 4.22–5.81)
RDW: 20.7 % — AB (ref 11.5–15.5)
WBC: 4.7 10*3/uL (ref 4.0–10.5)

## 2017-04-25 LAB — SAMPLE TO BLOOD BANK

## 2017-04-25 LAB — PREPARE RBC (CROSSMATCH)

## 2017-04-25 LAB — FERRITIN: FERRITIN: 4957 ng/mL — AB (ref 24–336)

## 2017-04-26 ENCOUNTER — Encounter (HOSPITAL_COMMUNITY): Payer: Self-pay

## 2017-04-26 ENCOUNTER — Encounter (HOSPITAL_BASED_OUTPATIENT_CLINIC_OR_DEPARTMENT_OTHER): Payer: Medicare HMO

## 2017-04-26 DIAGNOSIS — D471 Chronic myeloproliferative disease: Secondary | ICD-10-CM

## 2017-04-26 MED ORDER — ONDANSETRON 8 MG PO TBDP
8.0000 mg | ORAL_TABLET | Freq: Once | ORAL | Status: AC
  Administered 2017-04-26: 8 mg via ORAL
  Filled 2017-04-26: qty 1

## 2017-04-26 MED ORDER — DIPHENHYDRAMINE HCL 25 MG PO CAPS
25.0000 mg | ORAL_CAPSULE | Freq: Once | ORAL | Status: AC
  Administered 2017-04-26: 25 mg via ORAL

## 2017-04-26 MED ORDER — SODIUM CHLORIDE 0.9% FLUSH
3.0000 mL | INTRAVENOUS | Status: AC | PRN
  Administered 2017-04-26: 3 mL

## 2017-04-26 MED ORDER — DIPHENHYDRAMINE HCL 25 MG PO CAPS
ORAL_CAPSULE | ORAL | Status: AC
  Filled 2017-04-26: qty 1

## 2017-04-26 MED ORDER — SODIUM CHLORIDE 0.9 % IV SOLN
250.0000 mL | Freq: Once | INTRAVENOUS | Status: AC
  Administered 2017-04-26: 250 mL via INTRAVENOUS

## 2017-04-26 MED ORDER — ACETAMINOPHEN 325 MG PO TABS
ORAL_TABLET | ORAL | Status: AC
  Filled 2017-04-26: qty 2

## 2017-04-26 MED ORDER — ACETAMINOPHEN 325 MG PO TABS
650.0000 mg | ORAL_TABLET | Freq: Once | ORAL | Status: AC
  Administered 2017-04-26: 650 mg via ORAL

## 2017-04-26 NOTE — Patient Instructions (Signed)
[facility] at [facility] Discharge Instructions  RECOMMENDATIONS MADE BY THE CONSULTANT AND ANY TEST RESULTS WILL BE SENT TO YOUR REFERRING PHYSICIAN.  Received 2 units of PRBC's today. Follow-up as scheduled. Call clinic for any questions or concerns  Thank you for choosing [facility] at [facility] to provide your oncology and hematology care.  To afford each patient quality time with our provider, please arrive at least 15 minutes before your scheduled appointment time.    If you have a lab appointment with the Cancer Center please come in thru the  Main Entrance and check in at the main information desk  You need to re-schedule your appointment should you arrive 10 or more minutes late.  We strive to give you quality time with our providers, and arriving late affects you and other patients whose appointments are after yours.  Also, if you no show three or more times for appointments you may be dismissed from the clinic at the providers discretion.     Again, thank you for choosing [facility].  Our hope is that these requests will decrease the amount of time that you wait before being seen by our physicians.       _____________________________________________________________  Should you have questions after your visit to [facility], please contact our office at [phone] between the hours of 8:30 a.m. and 4:30 p.m.  Voicemails left after 4:30 p.m. will not be returned until the following business day.  For prescription refill requests, have your pharmacy contact our office.       Resources For Cancer Patients and their Caregivers ? American Cancer Society: Can assist with transportation, wigs, general needs, runs Look Good Feel Better.        1-888-227-6333 ? Cancer Care: Provides financial assistance, online support groups, medication/co-pay assistance.  1-800-813-HOPE (4673) ? [facility] Assists [place] cancer patients and their families through emotional , educational and financial support.  336-427-4357 ? [place] DSS Where to apply for food stamps, Medicaid and utility assistance. 336-342-1394 ? RC[facility]: Transportation to medical appointments. 336-347-2287 ? Social Security Administration: May apply for disability if have a Stage IV cancer. 336-342-7796 1-800-772-1213 ? [place] Aging, Disability and Transit Services: Assists with nutrition, care and transit needs. 336-349-2343  Cancer Center Support Programs: @10RELATIVEDAYS@ > Cancer Support Group  2nd Tuesday of the month 1pm-2pm, Journey Room  > Creative Journey  3rd Tuesday of the month 1130am-1pm, Journey Room  > Look Good Feel Better  1st Wednesday of the month 10am-12 noon, Journey Room (Call American Cancer Society to register 1-800-395-5775)   

## 2017-04-26 NOTE — Progress Notes (Signed)
[facility] tolerated blood transfusion well with only c/o nausea prior to starting transfusion. Pt was given Zofran ODT 8 mg PO per NP orders which relieved his nausea.VSS prior to,during and after blood transfusion. Pt discharged via wheelchair in satisfactory condition accompanied by his wife

## 2017-04-27 LAB — TYPE AND SCREEN
ABO/RH(D): O POS
ANTIBODY SCREEN: NEGATIVE
UNIT DIVISION: 0
UNIT DIVISION: 0

## 2017-04-27 LAB — BPAM RBC
Blood Product Expiration Date: 201901052359
Blood Product Expiration Date: 201901082359
ISSUE DATE / TIME: 201812140845
ISSUE DATE / TIME: 201812141050
UNIT TYPE AND RH: 5100
Unit Type and Rh: 5100

## 2017-05-05 IMAGING — US US ABDOMEN COMPLETE
1 series · 13 of 25 positions shown · non-contrast
Comparison: Abdominal ultrasound August 16, 2014

CLINICAL DATA: Iron overload, myelodysplastic syndrome for the past
5 years, coronary artery disease, CHF, hyperlipidemia, diabetes. ,
follow-up splenomegaly

EXAM:
ABDOMEN ULTRASOUND COMPLETE

[Series 1: us abdomen complete · 0.21mm/px · 13 of 118 slices shown]
[im 1/118]
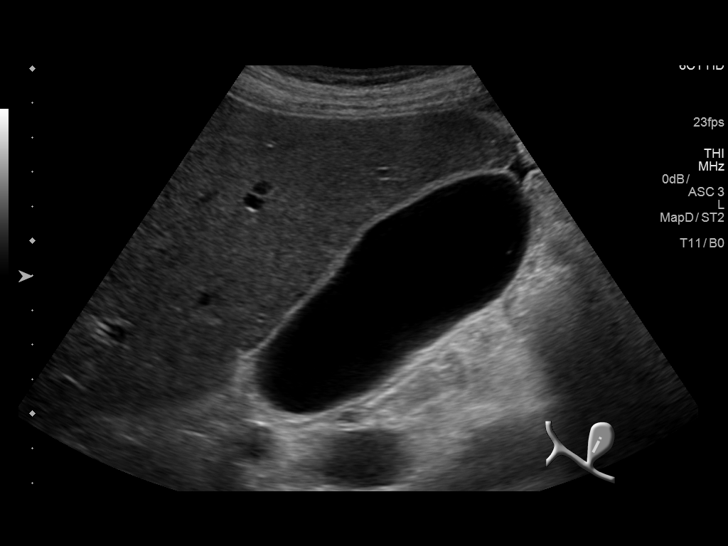
[im 10/118]
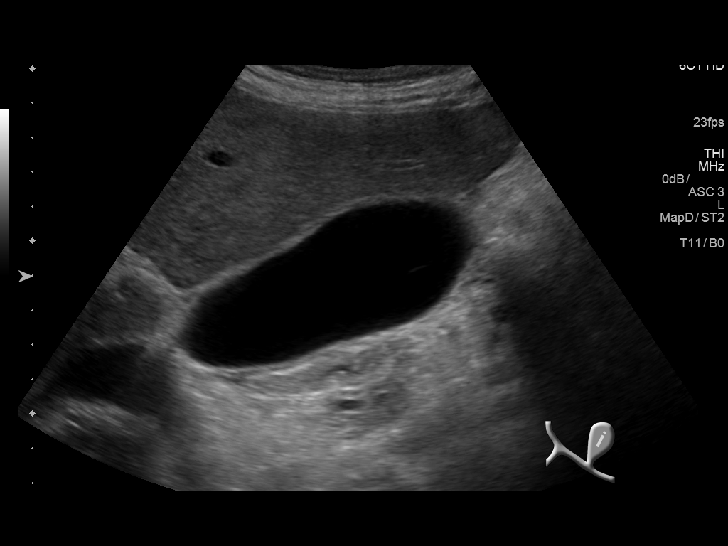
[im 20/118]
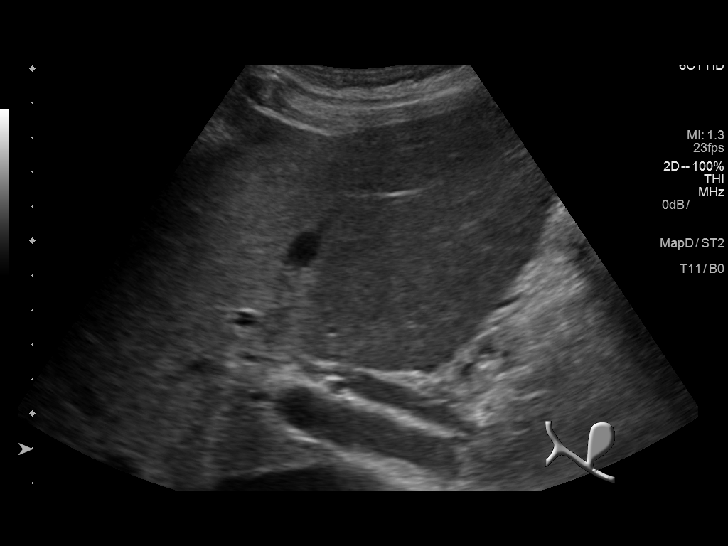
[im 30/118]
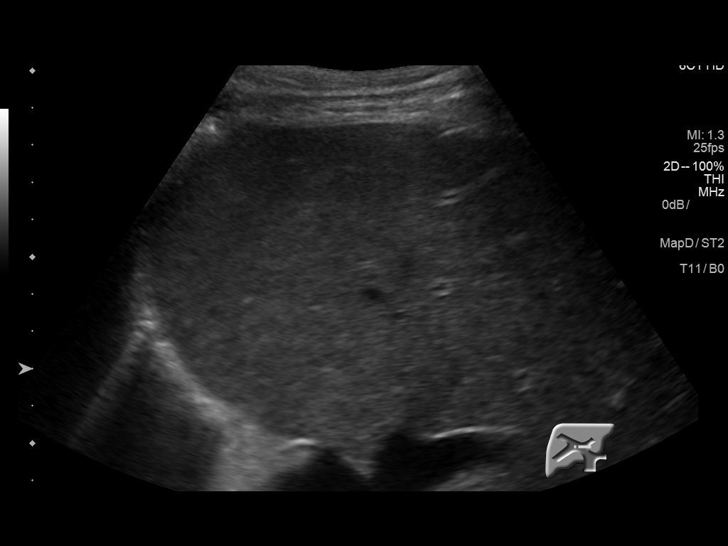
[im 40/118]
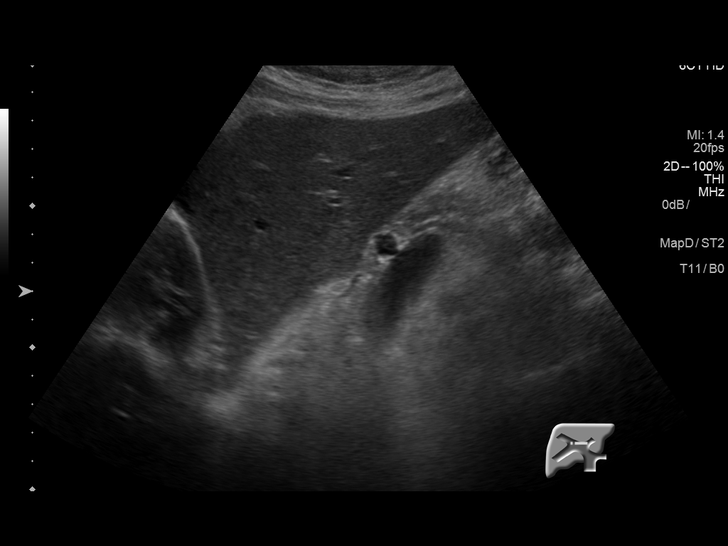
[im 49/118]
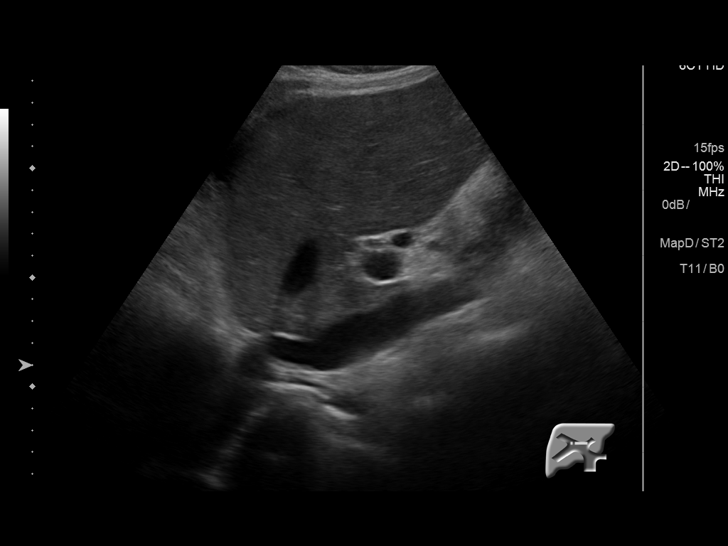
[im 59/118]
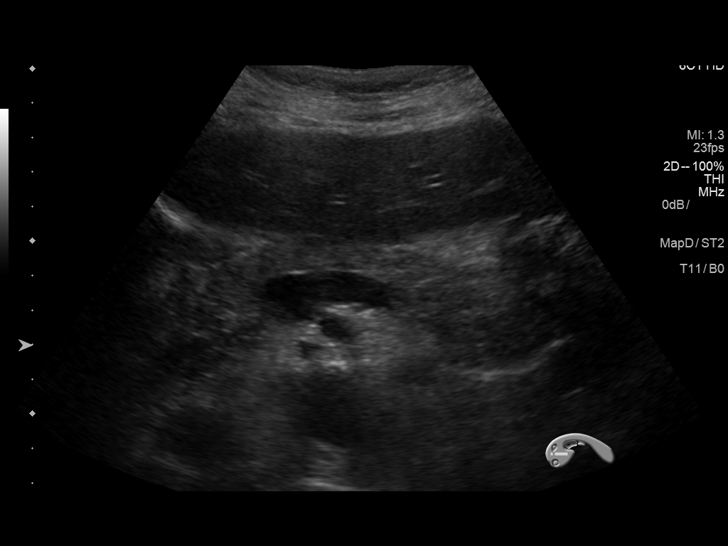
[im 69/118]
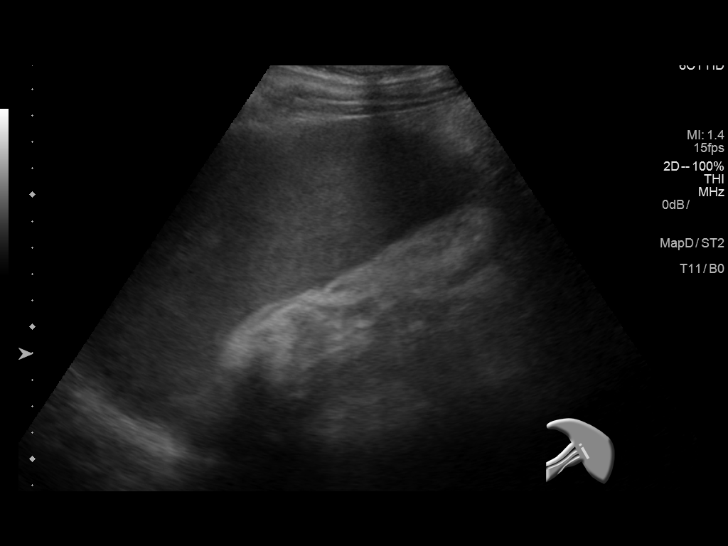
[im 79/118]
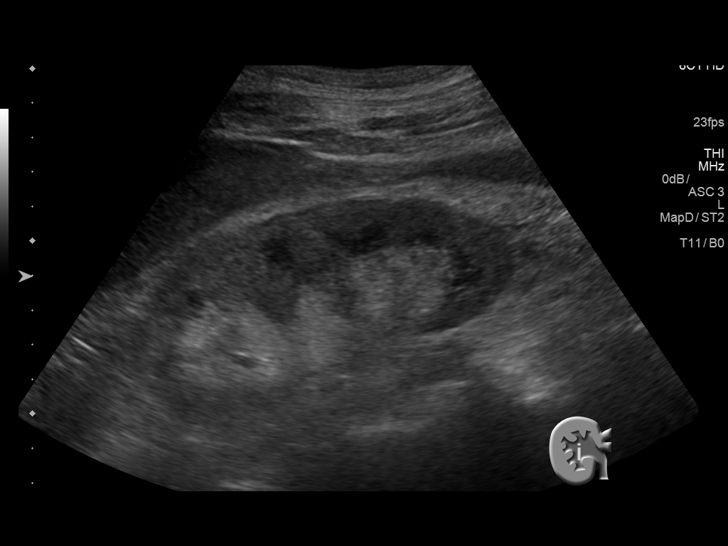
[im 88/118]
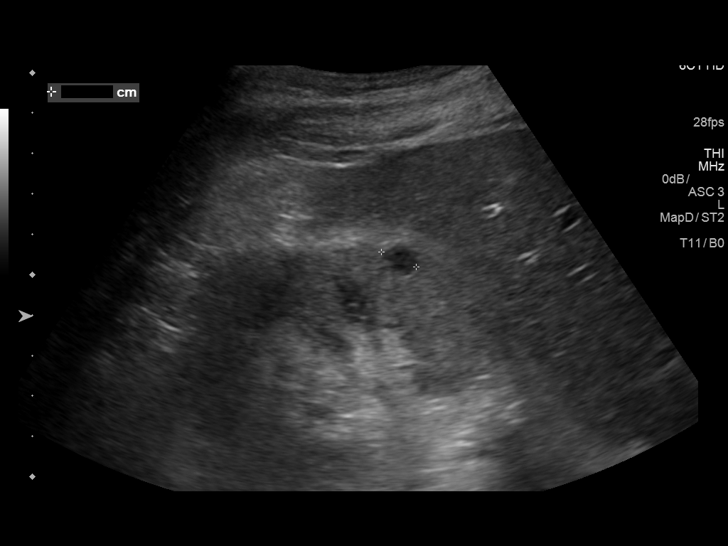
[im 98/118]
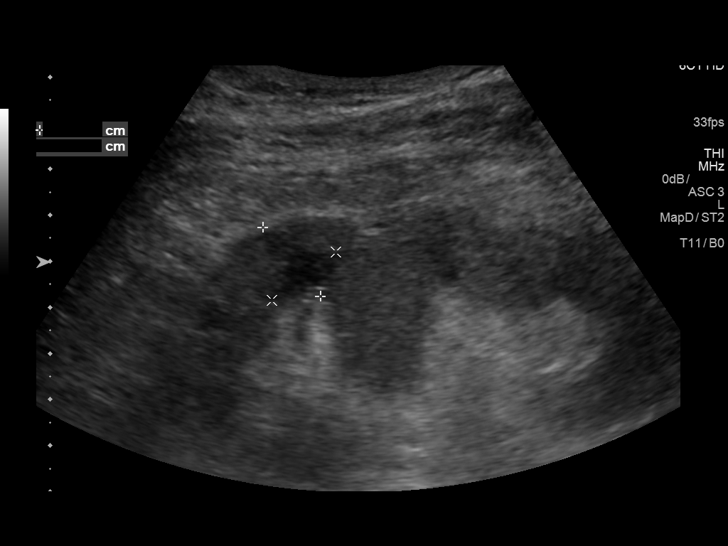
[im 108/118]
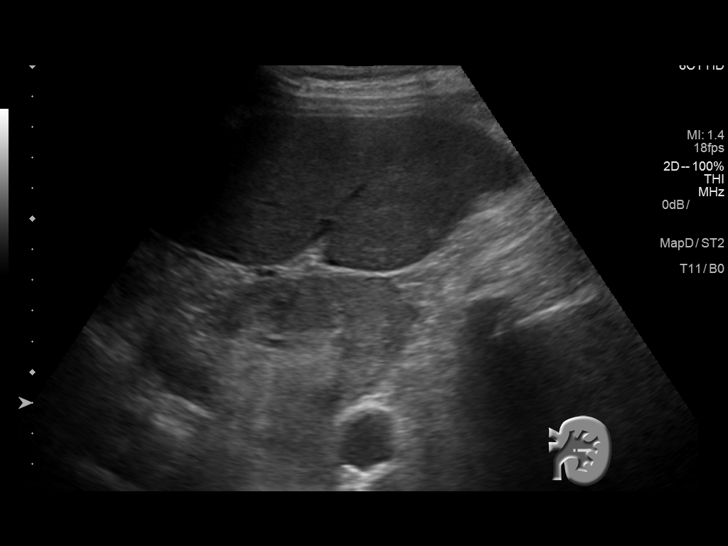
[im 118/118]
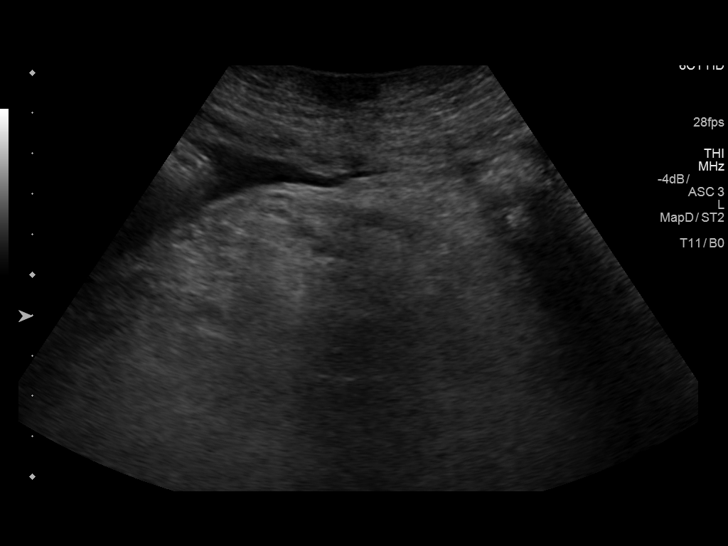

[13 of 25 positions shown; findings below may reference images not displayed]

FINDINGS: Gallbladder: No gallstones or wall thickening visualized. No
sonographic Murphy sign noted by sonographer.

Common bile duct: Diameter: 6.9 mm

Liver: The hepatic echotexture appears normal. A small amount of
ascites lies adjacent to the liver. There is no focal hepatic mass
nor ductal dilation.

IVC: No abnormality visualized.

Pancreas: Visualized portion unremarkable.

Spleen: Further increase in size of the spleen which measures 18.4 x
19.3 x 9.7 cm with a calculated volume of 5503 cc.

Right Kidney: Length: 11.7 cm.. The renal cortical echotexture is
increased and is equal tool that of the adjacent liver. There is a
cortical cyst measuring 1 cm in diameter in the midpole. There is a
irregular hypoechoic region in the upper pole measuring 2 x 1.7 x
1.1 cm. There is no hydronephrosis.

Left Kidney: Length: 11.8 cm . The cortical echotexture is
increased. There are vascular structures between the spleen and
liver which may reflect splenorenal varices.

Abdominal aorta: Limited visualization of the aorta. There is mural
plaque.

Other findings: None.
IMPRESSION: 1. Interval increase in the volume of the spleen which now all
measures 5503 cc. Possible splenorenal varices.
2. Increased renal cortical echotexture bilaterally. Cystic
structures in the right kidney with an upper pole complex appearing
structure not well characterized. A dedicated renal ultrasound
examination is recommended.
3. Normal appearance of the gallbladder and liver and pancreas.
4. Trace of ascites adjacent to the liver.

## 2017-05-08 ENCOUNTER — Other Ambulatory Visit (HOSPITAL_COMMUNITY): Payer: Medicare HMO

## 2017-05-09 ENCOUNTER — Other Ambulatory Visit (HOSPITAL_COMMUNITY): Payer: Medicare HMO

## 2017-05-22 ENCOUNTER — Ambulatory Visit (HOSPITAL_COMMUNITY): Payer: Medicare HMO

## 2017-05-22 ENCOUNTER — Other Ambulatory Visit (HOSPITAL_COMMUNITY): Payer: Medicare HMO

## 2017-05-22 ENCOUNTER — Inpatient Hospital Stay (HOSPITAL_COMMUNITY): Payer: Medicare HMO | Attending: Oncology

## 2017-05-22 DIAGNOSIS — R630 Anorexia: Secondary | ICD-10-CM | POA: Diagnosis not present

## 2017-05-22 DIAGNOSIS — D469 Myelodysplastic syndrome, unspecified: Secondary | ICD-10-CM | POA: Diagnosis not present

## 2017-05-22 DIAGNOSIS — D731 Hypersplenism: Secondary | ICD-10-CM | POA: Diagnosis not present

## 2017-05-22 DIAGNOSIS — M7989 Other specified soft tissue disorders: Secondary | ICD-10-CM | POA: Diagnosis not present

## 2017-05-22 DIAGNOSIS — I5042 Chronic combined systolic (congestive) and diastolic (congestive) heart failure: Secondary | ICD-10-CM | POA: Diagnosis not present

## 2017-05-22 DIAGNOSIS — I272 Pulmonary hypertension, unspecified: Secondary | ICD-10-CM | POA: Diagnosis not present

## 2017-05-22 DIAGNOSIS — R0602 Shortness of breath: Secondary | ICD-10-CM | POA: Insufficient documentation

## 2017-05-22 DIAGNOSIS — Z950 Presence of cardiac pacemaker: Secondary | ICD-10-CM | POA: Insufficient documentation

## 2017-05-22 DIAGNOSIS — Z1589 Genetic susceptibility to other disease: Secondary | ICD-10-CM

## 2017-05-22 DIAGNOSIS — J45909 Unspecified asthma, uncomplicated: Secondary | ICD-10-CM | POA: Insufficient documentation

## 2017-05-22 DIAGNOSIS — D7589 Other specified diseases of blood and blood-forming organs: Secondary | ICD-10-CM | POA: Diagnosis not present

## 2017-05-22 DIAGNOSIS — N184 Chronic kidney disease, stage 4 (severe): Secondary | ICD-10-CM | POA: Insufficient documentation

## 2017-05-22 DIAGNOSIS — E876 Hypokalemia: Secondary | ICD-10-CM | POA: Diagnosis not present

## 2017-05-22 DIAGNOSIS — R42 Dizziness and giddiness: Secondary | ICD-10-CM | POA: Diagnosis not present

## 2017-05-22 DIAGNOSIS — R197 Diarrhea, unspecified: Secondary | ICD-10-CM | POA: Insufficient documentation

## 2017-05-22 DIAGNOSIS — I509 Heart failure, unspecified: Secondary | ICD-10-CM | POA: Insufficient documentation

## 2017-05-22 DIAGNOSIS — R21 Rash and other nonspecific skin eruption: Secondary | ICD-10-CM | POA: Diagnosis not present

## 2017-05-22 DIAGNOSIS — D696 Thrombocytopenia, unspecified: Secondary | ICD-10-CM | POA: Insufficient documentation

## 2017-05-22 DIAGNOSIS — I2510 Atherosclerotic heart disease of native coronary artery without angina pectoris: Secondary | ICD-10-CM | POA: Diagnosis not present

## 2017-05-22 DIAGNOSIS — E119 Type 2 diabetes mellitus without complications: Secondary | ICD-10-CM | POA: Diagnosis not present

## 2017-05-22 DIAGNOSIS — M109 Gout, unspecified: Secondary | ICD-10-CM | POA: Insufficient documentation

## 2017-05-22 DIAGNOSIS — Z87891 Personal history of nicotine dependence: Secondary | ICD-10-CM | POA: Insufficient documentation

## 2017-05-22 DIAGNOSIS — D649 Anemia, unspecified: Secondary | ICD-10-CM

## 2017-05-22 DIAGNOSIS — I4891 Unspecified atrial fibrillation: Secondary | ICD-10-CM | POA: Insufficient documentation

## 2017-05-22 DIAGNOSIS — E785 Hyperlipidemia, unspecified: Secondary | ICD-10-CM | POA: Insufficient documentation

## 2017-05-22 DIAGNOSIS — E039 Hypothyroidism, unspecified: Secondary | ICD-10-CM | POA: Insufficient documentation

## 2017-05-22 DIAGNOSIS — I129 Hypertensive chronic kidney disease with stage 1 through stage 4 chronic kidney disease, or unspecified chronic kidney disease: Secondary | ICD-10-CM | POA: Diagnosis not present

## 2017-05-22 DIAGNOSIS — D471 Chronic myeloproliferative disease: Secondary | ICD-10-CM | POA: Insufficient documentation

## 2017-05-22 LAB — CBC WITH DIFFERENTIAL/PLATELET
BASOS PCT: 0 %
Basophils Absolute: 0 10*3/uL (ref 0.0–0.1)
EOS ABS: 0 10*3/uL (ref 0.0–0.7)
Eosinophils Relative: 1 %
HEMATOCRIT: 23.2 % — AB (ref 39.0–52.0)
HEMOGLOBIN: 7.5 g/dL — AB (ref 13.0–17.0)
LYMPHS ABS: 0.5 10*3/uL — AB (ref 0.7–4.0)
Lymphocytes Relative: 11 %
MCH: 30.9 pg (ref 26.0–34.0)
MCHC: 32.3 g/dL (ref 30.0–36.0)
MCV: 95.5 fL (ref 78.0–100.0)
MONO ABS: 0.2 10*3/uL (ref 0.1–1.0)
MONOS PCT: 3 %
NEUTROS ABS: 4.1 10*3/uL (ref 1.7–7.7)
NEUTROS PCT: 85 %
Platelets: 111 10*3/uL — ABNORMAL LOW (ref 150–400)
RBC: 2.43 MIL/uL — ABNORMAL LOW (ref 4.22–5.81)
RDW: 20 % — AB (ref 11.5–15.5)
WBC: 4.9 10*3/uL (ref 4.0–10.5)

## 2017-05-22 LAB — COMPREHENSIVE METABOLIC PANEL
ALBUMIN: 3.3 g/dL — AB (ref 3.5–5.0)
ALK PHOS: 87 U/L (ref 38–126)
ALT: 46 U/L (ref 17–63)
AST: 50 U/L — ABNORMAL HIGH (ref 15–41)
Anion gap: 15 (ref 5–15)
BUN: 43 mg/dL — ABNORMAL HIGH (ref 6–20)
CALCIUM: 8.3 mg/dL — AB (ref 8.9–10.3)
CO2: 29 mmol/L (ref 22–32)
CREATININE: 6.24 mg/dL — AB (ref 0.61–1.24)
Chloride: 97 mmol/L — ABNORMAL LOW (ref 101–111)
GFR calc non Af Amer: 8 mL/min — ABNORMAL LOW (ref >60)
GFR, EST AFRICAN AMERICAN: 9 mL/min — AB (ref >60)
GLUCOSE: 106 mg/dL — AB (ref 65–99)
Potassium: 3.1 mmol/L — ABNORMAL LOW (ref 3.5–5.1)
SODIUM: 141 mmol/L (ref 135–145)
Total Bilirubin: 0.6 mg/dL (ref 0.3–1.2)
Total Protein: 7.6 g/dL (ref 6.5–8.1)

## 2017-05-22 LAB — IRON AND TIBC
Iron: 134 ug/dL (ref 45–182)
SATURATION RATIOS: 65 % — AB (ref 17.9–39.5)
TIBC: 206 ug/dL — AB (ref 250–450)
UIBC: 72 ug/dL

## 2017-05-22 LAB — SAMPLE TO BLOOD BANK

## 2017-05-22 LAB — FERRITIN: Ferritin: 4953 ng/mL — ABNORMAL HIGH (ref 24–336)

## 2017-05-23 ENCOUNTER — Encounter (HOSPITAL_COMMUNITY): Payer: Self-pay | Admitting: Oncology

## 2017-05-23 ENCOUNTER — Other Ambulatory Visit: Payer: Self-pay

## 2017-05-23 ENCOUNTER — Other Ambulatory Visit (HOSPITAL_COMMUNITY): Payer: Medicare HMO

## 2017-05-23 ENCOUNTER — Inpatient Hospital Stay (HOSPITAL_COMMUNITY): Payer: Medicare HMO

## 2017-05-23 ENCOUNTER — Inpatient Hospital Stay (HOSPITAL_BASED_OUTPATIENT_CLINIC_OR_DEPARTMENT_OTHER): Payer: Medicare HMO | Admitting: Oncology

## 2017-05-23 VITALS — BP 142/65 | HR 60 | Temp 97.4°F | Resp 18

## 2017-05-23 VITALS — BP 104/51 | HR 61 | Temp 97.9°F | Resp 18 | Wt 181.6 lb

## 2017-05-23 DIAGNOSIS — I4891 Unspecified atrial fibrillation: Secondary | ICD-10-CM

## 2017-05-23 DIAGNOSIS — R42 Dizziness and giddiness: Secondary | ICD-10-CM | POA: Diagnosis not present

## 2017-05-23 DIAGNOSIS — D471 Chronic myeloproliferative disease: Secondary | ICD-10-CM

## 2017-05-23 DIAGNOSIS — I272 Pulmonary hypertension, unspecified: Secondary | ICD-10-CM

## 2017-05-23 DIAGNOSIS — R630 Anorexia: Secondary | ICD-10-CM

## 2017-05-23 DIAGNOSIS — D696 Thrombocytopenia, unspecified: Secondary | ICD-10-CM

## 2017-05-23 DIAGNOSIS — D469 Myelodysplastic syndrome, unspecified: Secondary | ICD-10-CM

## 2017-05-23 DIAGNOSIS — R197 Diarrhea, unspecified: Secondary | ICD-10-CM

## 2017-05-23 DIAGNOSIS — R21 Rash and other nonspecific skin eruption: Secondary | ICD-10-CM | POA: Diagnosis not present

## 2017-05-23 DIAGNOSIS — E119 Type 2 diabetes mellitus without complications: Secondary | ICD-10-CM

## 2017-05-23 DIAGNOSIS — R0602 Shortness of breath: Secondary | ICD-10-CM

## 2017-05-23 DIAGNOSIS — I5042 Chronic combined systolic (congestive) and diastolic (congestive) heart failure: Secondary | ICD-10-CM

## 2017-05-23 DIAGNOSIS — J45909 Unspecified asthma, uncomplicated: Secondary | ICD-10-CM | POA: Diagnosis not present

## 2017-05-23 DIAGNOSIS — E039 Hypothyroidism, unspecified: Secondary | ICD-10-CM

## 2017-05-23 DIAGNOSIS — E876 Hypokalemia: Secondary | ICD-10-CM | POA: Diagnosis not present

## 2017-05-23 DIAGNOSIS — D731 Hypersplenism: Secondary | ICD-10-CM

## 2017-05-23 DIAGNOSIS — Z87891 Personal history of nicotine dependence: Secondary | ICD-10-CM

## 2017-05-23 DIAGNOSIS — N184 Chronic kidney disease, stage 4 (severe): Secondary | ICD-10-CM

## 2017-05-23 DIAGNOSIS — I129 Hypertensive chronic kidney disease with stage 1 through stage 4 chronic kidney disease, or unspecified chronic kidney disease: Secondary | ICD-10-CM

## 2017-05-23 DIAGNOSIS — D7589 Other specified diseases of blood and blood-forming organs: Secondary | ICD-10-CM

## 2017-05-23 DIAGNOSIS — M7989 Other specified soft tissue disorders: Secondary | ICD-10-CM

## 2017-05-23 DIAGNOSIS — M109 Gout, unspecified: Secondary | ICD-10-CM

## 2017-05-23 DIAGNOSIS — E785 Hyperlipidemia, unspecified: Secondary | ICD-10-CM

## 2017-05-23 DIAGNOSIS — I509 Heart failure, unspecified: Secondary | ICD-10-CM

## 2017-05-23 DIAGNOSIS — Z950 Presence of cardiac pacemaker: Secondary | ICD-10-CM

## 2017-05-23 DIAGNOSIS — I2510 Atherosclerotic heart disease of native coronary artery without angina pectoris: Secondary | ICD-10-CM

## 2017-05-23 LAB — PREPARE RBC (CROSSMATCH)

## 2017-05-23 MED ORDER — DIPHENHYDRAMINE HCL 25 MG PO CAPS
25.0000 mg | ORAL_CAPSULE | Freq: Once | ORAL | Status: AC
  Administered 2017-05-23: 25 mg via ORAL
  Filled 2017-05-23: qty 1

## 2017-05-23 MED ORDER — SODIUM CHLORIDE 0.9 % IV SOLN: 250.0000 mL | Freq: Once | INTRAVENOUS | Status: DC

## 2017-05-23 MED ORDER — POTASSIUM CHLORIDE CRYS ER 20 MEQ PO TBCR: 20.0000 meq | EXTENDED_RELEASE_TABLET | Freq: Two times a day (BID) | ORAL | 0 refills | Status: DC

## 2017-05-23 MED ORDER — ACETAMINOPHEN 325 MG PO TABS
650.0000 mg | ORAL_TABLET | Freq: Once | ORAL | Status: AC
  Administered 2017-05-23: 650 mg via ORAL
  Filled 2017-05-23: qty 2

## 2017-05-23 NOTE — Progress Notes (Signed)
[facility] Follow up:    Nicolas Morel, MD [address] [place] [address]   DIAGNOSIS: Myelofibrosis  SUMMARY OF ONCOLOGIC HISTORY:   Primary myelofibrosis ([place])   06/27/2012 Initial Diagnosis    Primary myelofibrosis ([place])      12/08/2015 Imaging    Abdominal Ultrasound shows interval increase in the volume of the spleen which now measures 1806 cc. 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 Increased renal cortical echotexture bilaterally. Cystic structures in the right kidney with an upper pole complex appearing structure not well characterized. Normal appearance of the gallbladder and liver and pancreas. Trace of ascites adjacent to the liver.        CURRENT THERAPY: PRN blood transfusions  INTERVAL HISTORY: Patient returns today because he feels "terrible". He tells me "I know I need some more blood. I am drained". He is short of breath with exertion and complains of a rash on back, chest and arms. He manges this with OTC cream with relief. He also endorses leg swelling which is relieved with rest and elevation and occasional dizziness upon standing. He notes to change position slowly for this reason. Energy level is 25% and appetite is 50%. Patient was scheduled to return every 2 weeks but has canceled multiple appointments.  He was admitted to the hospital on 05/03/2017 where he complained of generalized weakness, increased dizziness and a nonproductive cough.  He was given 1 unit of packed red blood cells for his anemia (6.9). This was the last documented transfusion.  An echo was completed for his dizziness indicating severe tricuspid regurgitation dilated hypokinetic right ventricle that was drastically different from previous echo.  He was discharged on metoprolol, aspirin and atorvastatin. He is scheduled to have a coronary CTA as outpatient on 05/23/2017 by Eloisa [facility].   Patient Active Problem List   Diagnosis Date Noted  . Thrombocytopenia  ([place]) 01/23/2016  . Ascites 01/23/2016  . Diarrhea 12/26/2015  . Personal history of other medical treatment 11/25/2015  . History of inguinal hernia repair 11/25/2015  . Asthma without status asthmaticus 11/25/2015  . Primary myelofibrosis ([place]) 11/25/2015  . Iron overload due to repeated red blood cell transfusions 11/25/2015  . Metabolic acidosis with normal anion gap and bicarbonate losses 11/12/2015  . Absolute anemia 09/28/2015  . Other specified cardiac arrhythmias 06/21/2015  . Chronic combined systolic and diastolic heart failure ([place]) 02/24/2015  . Anemia in chronic illness 12/24/2014  . History of cardiac pacemaker in situ 10/15/2014  . 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 Pulmonary hypertension ([place]) 10/14/2014  . 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 Enlargement of spleen 08/25/2014  . 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 Type 2 diabetes mellitus ([place]) 04/03/2013  . Chronic kidney disease 04/03/2013  . Arteriosclerosis of coronary artery 04/03/2013  . Cardiomyopathy ([place]) 10/03/2012  . Chronic systolic heart failure ([place]) 10/03/2012  . Chronic kidney disease, stage IV (severe) ([place]) 10/03/2012  . Myelodysplastic syndrome ([place]) 07/23/2012  . HLD (hyperlipidemia) 06/15/2012  . BP (high blood pressure) 06/15/2012  . Gout 06/15/2012  . Chronic kidney disease (CKD), stage III (moderate) ([place]) 06/15/2012  . Congestive heart failure ([place]) 06/15/2012  . Atrial fibrillation ([place]) 12/24/2011  . Benign essential HTN 12/08/2010  . Disorder of function of stomach  12/08/2010    is allergic to lisinopril.  MEDICAL HISTORY: Past Medical History:  Diagnosis Date  . Anemia     He said teh cause has not been determined. His last transfusion requirement was about 4 years ago.  Marland Kitchen History of gout   . Hypertension   . Paroxysmal atrial fibrillation (HCC)   . Primary myelofibrosis ([place]) 11/25/2015    SURGICAL HISTORY: Past Surgical History:  Procedure Laterality Date  . Right inguinal hernia repair      SOCIAL HISTORY: Social History   Socioeconomic History  . Marital status: Married    Spouse name: Not on file  . Number of children: 1  . Years of education: Not on file  . Highest education level: Not on file  Social Needs  . Financial resource strain: Not on file  . Food insecurity - worry: Not on file  . Food insecurity - inability: Not on file  . Transportation needs - medical: Not on file  . Transportation needs - non-medical: Not on file  Occupational History  . Occupation: Retired  Tobacco Use  . Smoking status: Former Smoker    Types: Cigarettes    Last attempt to quit: 05/14/1981    Years since quitting: 36.0  . Smokeless tobacco: Never Used  Substance and Sexual Activity  . Alcohol use: No  . Drug use: No  . Sexual activity: Not on file  Other Topics Concern  . Not on file  Social History Narrative  . Not on file    FAMILY HISTORY: Family History  Problem Relation Age of Onset  . Heart failure Mother   . Hypertension Mother   . Kidney failure Father   . Hypertension Father   . Diabetes Brother   . Hypertension Brother     Review of Systems  Constitutional: Positive for fatigue. Negative for appetite change, chills and fever.  HENT:  Negative.   Eyes: Negative.   Respiratory: Positive for shortness of breath.   Cardiovascular: Positive for leg swelling. Negative for chest pain and palpitations.  Gastrointestinal: Negative.   Genitourinary: Negative.    Musculoskeletal: Negative.   Skin: Positive for rash.       On arms shoulder and back.  Managed with OTC medication.  Neurological: Positive for dizziness.   Hematological: Negative.   Psychiatric/Behavioral: Negative.     ROS as per HPI otherwise 12 point ROS is negative.   PHYSICAL EXAMINATION   Vitals:   05/23/17 1049  BP: (!) 104/51  Pulse: 61  Resp: 18  Temp: 97.9 F (36.6 C)  SpO2: 94%    Physical Exam  Constitutional: He is oriented to person, place, and time and well-developed, well-nourished, and in no distress.  HENT:  Head: Normocephalic and atraumatic.  Mouth/Throat: Mucous membranes are dry.  Neck: Normal range of motion. Neck supple.  Cardiovascular: Normal rate and regular rhythm.  Pulmonary/Chest: Effort normal and breath sounds normal.  Abdominal: Soft. Bowel sounds are normal.  Musculoskeletal: Normal range of motion.  Neurological: He is alert and oriented to person, place, and time.  Skin: Skin is warm, dry and intact. Rash noted. Rash is urticarial. There is pallor.  On back noted crusted over.   Constitutional: Elderly male in no distress.   HENT:  Head: Normocephalic and atraumatic.  Mouth/Throat: No oropharyngeal exudate. Mucosa moist. Eyes: Pupils are equal, round, and reactive to light. Conjunctivae are normal. No scleral icterus.  Neck: Normal range of motion. Neck supple. No JVD present.  Cardiovascular: Normal rate,  regular rhythm and normal heart sounds.  Exam reveals no gallop and no friction rub.   No murmur heard. Pulmonary/Chest: Effort normal and breath sounds normal. No respiratory distress. No wheezes.No rales.  Abdominal: Soft. Bowel sounds are normal. No distension. There is no tenderness. There is no guarding.  Musculoskeletal: No edema or tenderness.  Lymphadenopathy:    No cervical or supraclavicular adenopathy.  Neurological: Alert and oriented to person, place, and time. No cranial nerve deficit.  Skin: Skin is warm and dry. No rash noted. No erythema. No pallor. Rash noted on back- crusted over leisons. Psychiatric: Affect and ju labs today dgment normal.   LABORATORY  DATA:  CBC    Component Value Date/Time   WBC 4.9 05/22/2017 1146   RBC 2.43 (L) 05/22/2017 1146   HGB 7.5 (L) 05/22/2017 1146   HCT 23.2 (L) 05/22/2017 1146   PLT 111 (L) 05/22/2017 1146   MCV 95.5 05/22/2017 1146   MCH 30.9 05/22/2017 1146   MCHC 32.3 05/22/2017 1146   RDW 20.0 (H) 05/22/2017 1146   LYMPHSABS 0.5 (L) 05/22/2017 1146   MONOABS 0.2 05/22/2017 1146   EOSABS 0.0 05/22/2017 1146   BASOSABS 0.0 05/22/2017 1146    CMP     Component Value Date/Time   NA 141 05/22/2017 1146   K 3.1 (L) 05/22/2017 1146   CL 97 (L) 05/22/2017 1146   CO2 29 05/22/2017 1146   GLUCOSE 106 (H) 05/22/2017 1146   BUN 43 (H) 05/22/2017 1146   CREATININE 6.24 (H) 05/22/2017 1146   CALCIUM 8.3 (L) 05/22/2017 1146   PROT 7.6 05/22/2017 1146   ALBUMIN 3.3 (L) 05/22/2017 1146   AST 50 (H) 05/22/2017 1146   ALT 46 05/22/2017 1146   ALKPHOS 87 05/22/2017 1146   BILITOT 0.6 05/22/2017 1146   GFRNONAA 8 (L) 05/22/2017 1146   GFRAA 9 (L) 05/22/2017 1146       ASSESSMENT and THERAPY PLAN:   1)Primary myelofibrosis (HCC) Chronic myelofibrosis, JAK2 POSITIVE with poor tolerance (according to records) to [facility]. Transfusion dependent and iron overloaded secondary repeated transfusions, was on deferiprone, but now off of it. 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 Labs Today: hemoglobin of 7.5 hematocrit 23.2.  Ferritin remain significantly elevated. Hypokalemia: Potassium 3.1 today.  Patient will be seen by nephrologist on Thursday for dialysis.  We will hold off on potassium p.o. at this point.  Patient and wife educated on potassium rich foods and handout provided. Discussed in detail the potential side effects  if left untreated.  Patient states he was placed on a medication for a high potassium and that is why his potassium is low.  Reviewed chart and medications and cannot identify the medication he is referring to. He may have been referring to Calcium acetate for an elevated phosphorous but I am not sure. We will try to clarify.   Patient not a candidate for bone marrow transplant which is the only curative option.  He has been intolerant of Jakifi in the past.  RTC every 2 weeks for labs with possible blood transfusion.  RTC in 6 weeks for follow up/labs and possible blood transfusion. There are standing orders for CBC every 2 weeks and iron studies every 4 weeks.    It was  discussed at last visit with Dr. Talbert Cage that he was requiring more frequent blood transfusion which was an unfortunate sign. Goals of care was discussed but they were non receptive at this time. There continues to be a concern for iron overload and the development of RBC antibodies which will become an issue in the future.  Greater than 50% was spent in counseling and coordination of care with this patient including but not limited to discussion of the relevant topics above (See A&P) including, but not limited to diagnosis and management of acute and chronic medical conditions.   All questions were answered. The patient knows to call the clinic with any problems, questions or concerns. We can certainly see the patient much sooner if necessary. This note was electronically signed.  Jacquelin Hawking, NP 05/23/2017

## 2017-05-23 NOTE — Progress Notes (Signed)
Tolerated transfusion w/o adverse reaction.  Alert, in no distress.  VSS.  Discharged ambulatory.  

## 2017-05-23 NOTE — Patient Instructions (Signed)
[facility] at [facility] Discharge Instructions  RECOMMENDATIONS MADE BY THE CONSULTANT AND ANY TEST RESULTS WILL BE SENT TO YOUR REFERRING PHYSICIAN.  You were seen today by Jenny Burns, NP  Thank you for choosing [facility] at [facility] to provide your oncology and hematology care.  To afford each patient quality time with our provider, please arrive at least 15 minutes before your scheduled appointment time.    If you have a lab appointment with the Cancer Center please come in thru the  Main Entrance and check in at the main information desk  You need to re-schedule your appointment should you arrive 10 or more minutes late.  We strive to give you quality time with our providers, and arriving late affects you and other patients whose appointments are after yours.  Also, if you no show three or more times for appointments you may be dismissed from the clinic at the providers discretion.     Again, thank you for choosing [facility].  Our hope is that these requests will decrease the amount of time that you wait before being seen by our physicians.       _____________________________________________________________  Should you have questions after your visit to [facility], please contact our office at [phone] between the hours of 8:30 a.m. and 4:30 p.m.  Voicemails left after 4:30 p.m. will not be returned until the following business day.  For prescription refill requests, have your pharmacy contact our office.       Resources For Cancer Patients and their Caregivers ? American Cancer Society: Can assist with transportation, wigs, general needs, runs Look Good Feel Better.        1-888-227-6333 ? Cancer Care: Provides financial assistance, online support groups, medication/co-pay assistance.  1-800-813-HOPE (4673) ? [facility] Assists [place] cancer patients and their  families through emotional , educational and financial support.  336-427-4357 ? [place] DSS Where to apply for food stamps, Medicaid and utility assistance. 336-342-1394 ? RCATS: Transportation to medical appointments. 336-347-2287 ? Social Security Administration: May apply for disability if have a Stage IV cancer. 336-342-7796 1-800-772-1213 ? [place] Aging, Disability and Transit Services: Assists with nutrition, care and transit needs. 336-349-2343  Cancer Center Support Programs: @10RELATIVEDAYS@ > Cancer Support Group  2nd Tuesday of the month 1pm-2pm, Journey Room  > Creative Journey  3rd Tuesday of the month 1130am-1pm, Journey Room  > Look Good Feel Better  1st Wednesday of the month 10am-12 noon, Journey Room (Call American Cancer Society to register 1-800-395-5775)    

## 2017-05-24 LAB — TYPE AND SCREEN
ABO/RH(D): O POS
ANTIBODY SCREEN: NEGATIVE
UNIT DIVISION: 0

## 2017-05-24 LAB — BPAM RBC
BLOOD PRODUCT EXPIRATION DATE: 201902042359
ISSUE DATE / TIME: 201901101304
UNIT TYPE AND RH: 5100

## 2017-06-04 ENCOUNTER — Other Ambulatory Visit (HOSPITAL_COMMUNITY): Payer: Self-pay | Admitting: *Deleted

## 2017-06-04 DIAGNOSIS — D471 Chronic myeloproliferative disease: Secondary | ICD-10-CM

## 2017-06-05 ENCOUNTER — Other Ambulatory Visit (HOSPITAL_COMMUNITY): Payer: Self-pay | Admitting: *Deleted

## 2017-06-05 ENCOUNTER — Inpatient Hospital Stay (HOSPITAL_COMMUNITY): Payer: Medicare HMO

## 2017-06-05 ENCOUNTER — Encounter (HOSPITAL_COMMUNITY): Payer: Self-pay | Admitting: *Deleted

## 2017-06-05 DIAGNOSIS — D638 Anemia in other chronic diseases classified elsewhere: Secondary | ICD-10-CM

## 2017-06-05 DIAGNOSIS — D471 Chronic myeloproliferative disease: Secondary | ICD-10-CM | POA: Diagnosis not present

## 2017-06-05 DIAGNOSIS — D469 Myelodysplastic syndrome, unspecified: Secondary | ICD-10-CM

## 2017-06-05 LAB — COMPREHENSIVE METABOLIC PANEL
ALBUMIN: 3.4 g/dL — AB (ref 3.5–5.0)
ALK PHOS: 78 U/L (ref 38–126)
ALT: 44 U/L (ref 17–63)
ANION GAP: 15 (ref 5–15)
AST: 49 U/L — ABNORMAL HIGH (ref 15–41)
BILIRUBIN TOTAL: 0.3 mg/dL (ref 0.3–1.2)
BUN: 25 mg/dL — ABNORMAL HIGH (ref 6–20)
CALCIUM: 8.2 mg/dL — AB (ref 8.9–10.3)
CO2: 32 mmol/L (ref 22–32)
Chloride: 93 mmol/L — ABNORMAL LOW (ref 101–111)
Creatinine, Ser: 3.66 mg/dL — ABNORMAL HIGH (ref 0.61–1.24)
GFR, EST AFRICAN AMERICAN: 17 mL/min — AB (ref >60)
GFR, EST NON AFRICAN AMERICAN: 15 mL/min — AB (ref >60)
GLUCOSE: 121 mg/dL — AB (ref 65–99)
Potassium: 3 mmol/L — ABNORMAL LOW (ref 3.5–5.1)
Sodium: 140 mmol/L (ref 135–145)
TOTAL PROTEIN: 7.5 g/dL (ref 6.5–8.1)

## 2017-06-05 LAB — CBC WITH DIFFERENTIAL/PLATELET
BASOS PCT: 0 %
Basophils Absolute: 0 10*3/uL (ref 0.0–0.1)
EOS ABS: 0 10*3/uL (ref 0.0–0.7)
EOS PCT: 1 %
HCT: 22.2 % — ABNORMAL LOW (ref 39.0–52.0)
Hemoglobin: 7.1 g/dL — ABNORMAL LOW (ref 13.0–17.0)
LYMPHS ABS: 0.6 10*3/uL — AB (ref 0.7–4.0)
Lymphocytes Relative: 19 %
MCH: 30.9 pg (ref 26.0–34.0)
MCHC: 32 g/dL (ref 30.0–36.0)
MCV: 96.5 fL (ref 78.0–100.0)
MONOS PCT: 2 %
Monocytes Absolute: 0.1 10*3/uL (ref 0.1–1.0)
Neutro Abs: 2.6 10*3/uL (ref 1.7–7.7)
Neutrophils Relative %: 78 %
PLATELETS: 88 10*3/uL — AB (ref 150–400)
RBC: 2.3 MIL/uL — AB (ref 4.22–5.81)
RDW: 20.1 % — ABNORMAL HIGH (ref 11.5–15.5)
WBC: 3.3 10*3/uL — AB (ref 4.0–10.5)

## 2017-06-05 LAB — IRON AND TIBC
Iron: 203 ug/dL — ABNORMAL HIGH (ref 45–182)
Saturation Ratios: 99 % — ABNORMAL HIGH (ref 17.9–39.5)
TIBC: 206 ug/dL — AB (ref 250–450)
UIBC: 3 ug/dL

## 2017-06-05 LAB — SAMPLE TO BLOOD BANK

## 2017-06-05 LAB — FERRITIN: FERRITIN: 5084 ng/mL — AB (ref 24–336)

## 2017-06-05 LAB — PREPARE RBC (CROSSMATCH)

## 2017-06-05 NOTE — Progress Notes (Signed)
I spoke with Mittie Bodo, RN at [facility] in [place], [place] where patient goes for dialysis on MWF.  She advised that they do not give blood transfusion during dialysis.  I spoke with Dr. [place] about this and she states that we will give patient 1 unit of RBC tomorrow and then next week we will check his CBC again to assess for the need of another transfusion.

## 2017-06-06 ENCOUNTER — Other Ambulatory Visit (HOSPITAL_COMMUNITY): Payer: Medicare HMO

## 2017-06-06 ENCOUNTER — Encounter (HOSPITAL_COMMUNITY): Payer: Self-pay

## 2017-06-06 ENCOUNTER — Inpatient Hospital Stay (HOSPITAL_COMMUNITY): Payer: Medicare HMO

## 2017-06-06 DIAGNOSIS — D469 Myelodysplastic syndrome, unspecified: Secondary | ICD-10-CM

## 2017-06-06 DIAGNOSIS — D638 Anemia in other chronic diseases classified elsewhere: Secondary | ICD-10-CM

## 2017-06-06 DIAGNOSIS — D471 Chronic myeloproliferative disease: Secondary | ICD-10-CM | POA: Diagnosis not present

## 2017-06-06 MED ORDER — DIPHENHYDRAMINE HCL 25 MG PO CAPS
ORAL_CAPSULE | ORAL | Status: AC
  Filled 2017-06-06: qty 1

## 2017-06-06 MED ORDER — SODIUM CHLORIDE 0.9% FLUSH
10.0000 mL | INTRAVENOUS | Status: AC | PRN
  Administered 2017-06-06: 10 mL

## 2017-06-06 MED ORDER — DIPHENHYDRAMINE HCL 25 MG PO CAPS
25.0000 mg | ORAL_CAPSULE | Freq: Once | ORAL | Status: AC
  Administered 2017-06-06: 25 mg via ORAL

## 2017-06-06 MED ORDER — ACETAMINOPHEN 325 MG PO TABS
650.0000 mg | ORAL_TABLET | Freq: Once | ORAL | Status: AC
  Administered 2017-06-06: 650 mg via ORAL

## 2017-06-06 MED ORDER — ACETAMINOPHEN 325 MG PO TABS
ORAL_TABLET | ORAL | Status: AC
  Filled 2017-06-06: qty 2

## 2017-06-06 MED ORDER — SODIUM CHLORIDE 0.9 % IV SOLN
250.0000 mL | Freq: Once | INTRAVENOUS | Status: AC
  Administered 2017-06-06: 250 mL via INTRAVENOUS

## 2017-06-06 NOTE — Progress Notes (Signed)
[facility] tolerated blood transfusion well without complaints or incident. VSS upon discharge. Pt discharged via wheelchair in satisfactory condition

## 2017-06-06 NOTE — Patient Instructions (Signed)
[place] at [facility] Discharge Instructions  RECOMMENDATIONS MADE BY THE CONSULTANT AND ANY TEST RESULTS WILL BE SENT TO YOUR REFERRING PHYSICIAN Received 1 unit of PRBC's today. Follow-up as scheduled. Call clinic for any questions or concerns Thank you for choosing [place] at [facility] to provide your oncology and hematology care.  To afford each patient quality time with our provider, please arrive at least 15 minutes before your scheduled appointment time.    If you have a lab appointment with the [facility] please come in thru the  Main Entrance and check in at the main information desk  You need to re-schedule your appointment should you arrive 10 or more minutes late.  We strive to give you quality time with our providers, and arriving late affects you and other patients whose appointments are after yours.  Also, if you no show three or more times for appointments you may be dismissed from the clinic at the providers discretion.     Again, thank you for choosing [facility].  Our hope is that these requests will decrease the amount of time that you wait before being seen by our physicians.       _____________________________________________________________  Should you have questions after your visit to [facility], please contact our office at [phone] between the hours of 8:30 a.m. and 4:30 p.m.  Voicemails left after 4:30 p.m. will not be returned until the following business day.  For prescription refill requests, have your pharmacy contact our office.       Resources For Cancer Patients and their Caregivers ? American Cancer Society: Can assist with transportation, wigs, general needs, runs Look Good Feel Better.        773-796-2954 ? Cancer Care: Provides financial assistance, online support groups, medication/co-pay assistance.  1-800-813-HOPE (502) 813-7374) ? [facility] Assists [place] cancer patients and their families through emotional , educational and financial support.  405-691-6739 ? [place] DSS Where to apply for food stamps, Medicaid and utility assistance. 854-213-7554 ? RCATS: Transportation to medical appointments. (223)412-3091 ? Social Security Administration: May apply for disability if have a Stage IV cancer. 432-495-6672 985-278-1302 ? [employer], Disability and Transit Services: Assists with nutrition, care and transit needs. [place] Support Programs: @10RELATIVEDAYS @ > Cancer Support Group  2nd Tuesday of the month 1pm-2pm, Journey Room  > Creative Journey  3rd Tuesday of the month 1130am-1pm, Journey Room  > Look Good Feel Better  1st Wednesday of the month 10am-12 noon, Journey Room (Call [place] to register [phone])

## 2017-06-07 ENCOUNTER — Encounter (HOSPITAL_COMMUNITY): Payer: Medicare HMO

## 2017-06-07 LAB — BPAM RBC
Blood Product Expiration Date: 201902042359
ISSUE DATE / TIME: 201901240922
UNIT TYPE AND RH: 5100

## 2017-06-07 LAB — TYPE AND SCREEN
ABO/RH(D): O POS
ANTIBODY SCREEN: NEGATIVE
Unit division: 0

## 2017-06-10 ENCOUNTER — Other Ambulatory Visit (HOSPITAL_COMMUNITY): Payer: Medicare HMO

## 2017-06-10 ENCOUNTER — Inpatient Hospital Stay (HOSPITAL_COMMUNITY): Payer: Medicare HMO

## 2017-06-10 DIAGNOSIS — D471 Chronic myeloproliferative disease: Secondary | ICD-10-CM

## 2017-06-10 LAB — CBC WITH DIFFERENTIAL/PLATELET
BASOS ABS: 0 10*3/uL (ref 0.0–0.1)
Basophils Relative: 0 %
EOS PCT: 1 %
Eosinophils Absolute: 0 10*3/uL (ref 0.0–0.7)
HCT: 24.8 % — ABNORMAL LOW (ref 39.0–52.0)
HEMOGLOBIN: 7.9 g/dL — AB (ref 13.0–17.0)
LYMPHS PCT: 21 %
Lymphs Abs: 0.6 10*3/uL — ABNORMAL LOW (ref 0.7–4.0)
MCH: 30.6 pg (ref 26.0–34.0)
MCHC: 31.9 g/dL (ref 30.0–36.0)
MCV: 96.1 fL (ref 78.0–100.0)
Monocytes Absolute: 0 10*3/uL — ABNORMAL LOW (ref 0.1–1.0)
Monocytes Relative: 1 %
NEUTROS PCT: 77 %
Neutro Abs: 2.3 10*3/uL (ref 1.7–7.7)
PLATELETS: 75 10*3/uL — AB (ref 150–400)
RBC: 2.58 MIL/uL — ABNORMAL LOW (ref 4.22–5.81)
RDW: 19.9 % — ABNORMAL HIGH (ref 11.5–15.5)
WBC: 3 10*3/uL — ABNORMAL LOW (ref 4.0–10.5)

## 2017-06-10 LAB — IRON AND TIBC
IRON: 211 ug/dL — AB (ref 45–182)
Saturation Ratios: 97 % — ABNORMAL HIGH (ref 17.9–39.5)
TIBC: 218 ug/dL — ABNORMAL LOW (ref 250–450)
UIBC: 7 ug/dL

## 2017-06-10 LAB — SAMPLE TO BLOOD BANK

## 2017-06-10 LAB — FERRITIN: FERRITIN: 4799 ng/mL — AB (ref 24–336)

## 2017-06-10 LAB — PREPARE RBC (CROSSMATCH)

## 2017-06-11 ENCOUNTER — Inpatient Hospital Stay (HOSPITAL_COMMUNITY): Payer: Medicare HMO

## 2017-06-11 ENCOUNTER — Encounter (HOSPITAL_COMMUNITY): Payer: Self-pay

## 2017-06-11 DIAGNOSIS — D471 Chronic myeloproliferative disease: Secondary | ICD-10-CM | POA: Diagnosis not present

## 2017-06-11 MED ORDER — ACETAMINOPHEN 325 MG PO TABS
650.0000 mg | ORAL_TABLET | Freq: Once | ORAL | Status: AC
  Administered 2017-06-11: 650 mg via ORAL

## 2017-06-11 MED ORDER — DIPHENHYDRAMINE HCL 25 MG PO CAPS
ORAL_CAPSULE | ORAL | Status: AC
  Filled 2017-06-11: qty 1

## 2017-06-11 MED ORDER — DIPHENHYDRAMINE HCL 25 MG PO CAPS
25.0000 mg | ORAL_CAPSULE | Freq: Once | ORAL | Status: AC
  Administered 2017-06-11: 25 mg via ORAL

## 2017-06-11 MED ORDER — SODIUM CHLORIDE 0.9 % IV SOLN
250.0000 mL | Freq: Once | INTRAVENOUS | Status: AC
  Administered 2017-06-11: 250 mL via INTRAVENOUS

## 2017-06-11 MED ORDER — SODIUM CHLORIDE 0.9% FLUSH
10.0000 mL | INTRAVENOUS | Status: AC | PRN
  Administered 2017-06-11: 10 mL

## 2017-06-11 MED ORDER — ACETAMINOPHEN 325 MG PO TABS
ORAL_TABLET | ORAL | Status: AC
  Filled 2017-06-11: qty 2

## 2017-06-11 NOTE — Progress Notes (Signed)
Patient tolerated blood transfusion with no complaints voiced.  Peripheral IV site clean and dry with no bruising or swelling noted at site.  Band aid applied.  Reviewed blood transfusion precautions with the patient and family with understanding verbalized.  VSS with discharge and left via wheelchair in satisfactory condition.

## 2017-06-11 NOTE — Patient Instructions (Signed)
[facility] at [facility]  Discharge Instructions:  You received a blood transfusion today.  _______________________________________________________________  Thank you for choosing [facility] at [facility] to provide your oncology and hematology care.  To afford each patient quality time with our providers, please arrive at least 15 minutes before your scheduled appointment.  You need to re-schedule your appointment if you arrive 10 or more minutes late.  We strive to give you quality time with our providers, and arriving late affects you and other patients whose appointments are after yours.  Also, if you no show three or more times for appointments you may be dismissed from the clinic.  Again, thank you for choosing [facility] at [facility]. Our hope is that these requests will allow you access to exceptional care and in a timely manner. _______________________________________________________________  If you have questions after your visit, please contact our office at [phone] between the hours of 8:30 a.m. and 5:00 p.m. Voicemails left after 4:30 p.m. will not be returned until the following business day. _______________________________________________________________  For prescription refill requests, have your pharmacy contact our office. _______________________________________________________________  Recommendations made by the consultant and any test results will be sent to your referring physician. _______________________________________________________________ 

## 2017-06-12 LAB — TYPE AND SCREEN
ABO/RH(D): O POS
Antibody Screen: NEGATIVE
UNIT DIVISION: 0

## 2017-06-12 LAB — BPAM RBC
Blood Product Expiration Date: 201903012359
ISSUE DATE / TIME: 201901290901
Unit Type and Rh: 5100

## 2017-06-19 ENCOUNTER — Other Ambulatory Visit (HOSPITAL_COMMUNITY): Payer: Self-pay | Admitting: *Deleted

## 2017-06-19 DIAGNOSIS — D471 Chronic myeloproliferative disease: Secondary | ICD-10-CM

## 2017-06-20 ENCOUNTER — Inpatient Hospital Stay (HOSPITAL_COMMUNITY): Payer: Medicare HMO

## 2017-06-20 ENCOUNTER — Inpatient Hospital Stay (HOSPITAL_COMMUNITY): Payer: Medicare HMO | Attending: Internal Medicine | Admitting: Internal Medicine

## 2017-06-20 ENCOUNTER — Encounter (HOSPITAL_COMMUNITY): Payer: Self-pay | Admitting: Internal Medicine

## 2017-06-20 VITALS — BP 108/55 | HR 59 | Temp 97.6°F | Resp 18 | Wt 182.0 lb

## 2017-06-20 DIAGNOSIS — Z992 Dependence on renal dialysis: Secondary | ICD-10-CM | POA: Diagnosis not present

## 2017-06-20 DIAGNOSIS — D471 Chronic myeloproliferative disease: Secondary | ICD-10-CM | POA: Diagnosis present

## 2017-06-20 DIAGNOSIS — N189 Chronic kidney disease, unspecified: Secondary | ICD-10-CM | POA: Diagnosis not present

## 2017-06-20 DIAGNOSIS — D72819 Decreased white blood cell count, unspecified: Secondary | ICD-10-CM | POA: Diagnosis not present

## 2017-06-20 DIAGNOSIS — D696 Thrombocytopenia, unspecified: Secondary | ICD-10-CM | POA: Diagnosis not present

## 2017-06-20 DIAGNOSIS — D731 Hypersplenism: Secondary | ICD-10-CM | POA: Insufficient documentation

## 2017-06-20 DIAGNOSIS — D474 Osteomyelofibrosis: Secondary | ICD-10-CM

## 2017-06-20 DIAGNOSIS — D631 Anemia in chronic kidney disease: Secondary | ICD-10-CM | POA: Insufficient documentation

## 2017-06-20 LAB — CBC WITH DIFFERENTIAL/PLATELET
BASOS PCT: 0 %
Basophils Absolute: 0 10*3/uL (ref 0.0–0.1)
Eosinophils Absolute: 0 10*3/uL (ref 0.0–0.7)
Eosinophils Relative: 1 %
HEMATOCRIT: 24.1 % — AB (ref 39.0–52.0)
HEMOGLOBIN: 7.9 g/dL — AB (ref 13.0–17.0)
LYMPHS ABS: 0.6 10*3/uL — AB (ref 0.7–4.0)
Lymphocytes Relative: 24 %
MCH: 32.1 pg (ref 26.0–34.0)
MCHC: 32.8 g/dL (ref 30.0–36.0)
MCV: 98 fL (ref 78.0–100.0)
MONO ABS: 0 10*3/uL — AB (ref 0.1–1.0)
MONOS PCT: 1 %
NEUTROS ABS: 2 10*3/uL (ref 1.7–7.7)
NEUTROS PCT: 74 %
Platelets: 85 10*3/uL — ABNORMAL LOW (ref 150–400)
RBC: 2.46 MIL/uL — ABNORMAL LOW (ref 4.22–5.81)
RDW: 19.4 % — AB (ref 11.5–15.5)
WBC: 2.6 10*3/uL — ABNORMAL LOW (ref 4.0–10.5)

## 2017-06-20 LAB — SAMPLE TO BLOOD BANK

## 2017-06-20 NOTE — Patient Instructions (Signed)
[facility] at [facility]  Discharge Instructions:  You were seen by Dr. [place] today _______________________________________________________________  Thank you for choosing [facility] at [facility] to provide your oncology and hematology care.  To afford each patient quality time with our providers, please arrive at least 15 minutes before your scheduled appointment.  You need to re-schedule your appointment if you arrive 10 or more minutes late.  We strive to give you quality time with our providers, and arriving late affects you and other patients whose appointments are after yours.  Also, if you no show three or more times for appointments you may be dismissed from the clinic.  Again, thank you for choosing [facility] at [facility]. Our hope is that these requests will allow you access to exceptional care and in a timely manner. _______________________________________________________________  If you have questions after your visit, please contact our office at [phone] between the hours of 8:30 a.m. and 5:00 p.m. Voicemails left after 4:30 p.m. will not be returned until the following business day. _______________________________________________________________  For prescription refill requests, have your pharmacy contact our office. _______________________________________________________________  Recommendations made by the consultant and any test results will be sent to your referring physician. _______________________________________________________________ 

## 2017-06-21 ENCOUNTER — Encounter (HOSPITAL_COMMUNITY): Payer: Medicare HMO

## 2017-06-27 NOTE — Progress Notes (Signed)
[facility] Follow up:   DIAGNOSIS: Myelofibrosis- JAK 2 positive.  SUMMARY OF ONCOLOGIC HISTORY:   Primary myelofibrosis ([place])   06/27/2012 Initial Diagnosis    Primary myelofibrosis ([place])      12/08/2015 Imaging    Abdominal Ultrasound shows interval increase in the volume of the spleen which now measures 1806 cc. 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 Increased renal cortical echotexture bilaterally. Cystic structures in the right kidney with an upper pole complex appearing structure not well characterized. Normal appearance of the gallbladder and liver and pancreas. Trace of ascites adjacent to the liver.        CURRENT THERAPY: PRN blood transfusions  INTERVAL HISTORY: Returns for follow up. He is now on hemodialysis TIW. He continues to feel tired. No chest pain, no syncope No reported bleeding. Or bruising.  PHYSICAL EXAMINATION Aaox3, no acute distress.   Vitals:   06/20/17 1545  BP: (!) 108/55  Pulse: (!) 59  Resp: 18  Temp: 97.6 F (36.4 C)  SpO2: 100%  LABORATORY DATA:  CBC    Component Value Date/Time   WBC 2.6 (L) 06/20/2017 1447   RBC 2.46 (L) 06/20/2017 1447   HGB 7.9 (L) 06/20/2017 1447   HCT 24.1 (L) 06/20/2017 1447   PLT 85 (L) 06/20/2017 1447   MCV 98.0 06/20/2017 1447   MCH 32.1 06/20/2017 1447   MCHC 32.8 06/20/2017 1447   RDW 19.4 (H) 06/20/2017 1447   LYMPHSABS 0.6 (L) 06/20/2017 1447   MONOABS 0.0 (L) 06/20/2017 1447   EOSABS 0.0 06/20/2017 1447   BASOSABS 0.0 06/20/2017 1447    CMP     Component Value Date/Time   NA 140 06/05/2017 1155   K 3.0 (L) 06/05/2017 1155   CL 93 (L) 06/05/2017 1155   CO2 32 06/05/2017 1155   GLUCOSE 121 (H) 06/05/2017 1155   BUN 25 (H) 06/05/2017 1155   CREATININE 3.66 (H) 06/05/2017 1155   CALCIUM 8.2 (L) 06/05/2017 1155   PROT 7.5 06/05/2017 1155   ALBUMIN 3.4 (L) 06/05/2017 1155   AST 49 (H) 06/05/2017 1155   ALT 44 06/05/2017 1155   ALKPHOS 78 06/05/2017 1155   BILITOT 0.3 06/05/2017 1155    GFRNONAA 15 (L) 06/05/2017 1155   GFRAA 17 (L) 06/05/2017 1155     ASSESSMENT & PLAN: Primary myelofibrosis  JAK2 POSITIVE with poor tolerance (according to records) to [facility]- transfusion dependent  Iron overloaded secondary repeated transfusions, was on Deferiprone, but now off of it due to intolerance and renal failure Anemia -symptomatic and multifactorial due to myelofibrosis, anemia of chronic disease, 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 No transfusion required today He expressed his frustration with having to travel this far for lab and type screen and return the next day for transfusion. This in addition to the TIW Hemodialysis in [place], [place], he feels has been tiring. He states his PCP is at [facility] in [place], and therefore has no provider who can order transfusions locally at [facility] I will try and get in touch with his nephrologist in [place] to see if transfusions could be arranged during dialysis. Until we can make definitive arrangements, he will continue q 2 weekly lab with transfusion for platelets less than 10, hgb less than 7. He continues to decline hospice services.    Creola Corn, MD 06/27/2017

## 2017-07-03 ENCOUNTER — Telehealth: Payer: Self-pay | Admitting: Oncology

## 2017-07-03 ENCOUNTER — Telehealth (HOSPITAL_COMMUNITY): Payer: Self-pay | Admitting: *Deleted

## 2017-07-03 NOTE — Telephone Encounter (Signed)
Patient's wife called to advise [place] that his hgb was 5 at dialysis on Monday.  She wants him to come here for blood work and get a blood transfusion.    I spoke with Mike Craze, NP who states that patient could come today for blood work and then we could transfuse if he needs it.  Based upon patient's last visit note, patient was to be managed by nephrologist, but we will see him if family is willing to come in.  I called back and talked with patient's wife and she states that patient wasn't well enough to go to dialysis today and she wasn't sure if she could get him here for the blood work.  I advised her to take him to the ER.  Wife states " so if I bring him there for blood transfusion we can get it"  I explained to her that if patient wasn't well enough to get dialysis today and then we transfuse him, it could put him into fluid overload.  I recommended again for her to take him to the nearest ER.  She states " well I am going to call the dialysis center and let them know "

## 2017-07-03 NOTE — Telephone Encounter (Signed)
Received a phone call from Kendell Bane with [facility] stating that patient was currently in route to the emergency room for a hemoglobin of 5.9. She requested most recent notes from 06/20/16 where he was seen by Dr. [place]. Patient states "he is tired of getting blood transfusions and dialysis" but continues to be not receptive to hospice services. He is scheduled to return to see [place] for labs on 07/09/2017.  Faythe Casa, NP 07/03/2017 10:20 AM

## 2017-07-08 NOTE — Progress Notes (Deleted)
[place], [place] [address]   CLINIC:  Medical Oncology/Hematology  PCP:  [name], MD [address]  [place] [address] [phone]   REASON FOR VISIT:  Follow-up for Myelofibrosis, JAK2+, transfusion-dependent  CURRENT THERAPY: Blood product transfusions prn    BRIEF ONCOLOGIC HISTORY:    Primary myelofibrosis ([place])   06/27/2012 Initial Diagnosis    Primary myelofibrosis ([place])      12/08/2015 Imaging    Abdominal Ultrasound shows interval increase in the volume of the spleen which now measures 1806 cc. 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 Increased renal cortical echotexture bilaterally. Cystic structures in the right kidney with an upper pole complex appearing structure not well characterized. Normal appearance of the gallbladder and liver and pancreas. Trace of ascites adjacent to the liver.         HISTORY OF PRESENT ILLNESS:  (From Dr. 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 No transfusion required today He expressed his frustration with having to travel this far for lab and type screen and return the next day for transfusion. This in addition to the TIW Hemodialysis in [place], [place], he feels has been tiring. He states his PCP is at [facility] in [place], and therefore has no provider who can order transfusions locally at [facility] I will try and get in touch with his nephrologist in [place] to see if transfusions could be arranged during  dialysis. Until we can make definitive arrangements, he will continue q 2 weekly lab with transfusion for platelets less than 10, hgb less than 7. He continues to decline hospice services.     INTERVAL HISTORY:  Mr. Olguin 77 y.o. male returns for follow-up for primary myelofibrosis.   Here today with ***.   Remains on hemodialysis 3x/week (Tues/Thurs/Sat)??*** in [place].    ***   REVIEW OF SYSTEMS:  Review of Systems - Oncology   PAST MEDICAL/SURGICAL HISTORY:  Past Medical History:  Diagnosis Date  . Anemia    He said teh cause has not been determined. His last transfusion requirement was about 4 years ago.  Marland Kitchen History of gout   . Hypertension   . Paroxysmal atrial fibrillation (HCC)   . Primary myelofibrosis ([place]) 11/25/2015   Past Surgical History:  Procedure Laterality Date  . Right inguinal hernia repair       SOCIAL HISTORY:  Social History   Socioeconomic History  . Marital status: Married    Spouse name: Not on file  . Number of children: 1  . Years of education: Not on file  . Highest education level: Not on file  Social Needs  . Financial resource strain: Not on file  . Food insecurity - worry: Not on file  . Food insecurity - inability: Not on file  . Transportation needs - medical: Not on file  . Transportation needs - non-medical: Not on file  Occupational History  . Occupation: Retired  Tobacco Use  . Smoking status: Former Smoker    Types: Cigarettes    Last attempt to quit: 05/14/1981    Years since quitting: 36.1  . Smokeless  tobacco: Never Used  Substance and Sexual Activity  . Alcohol use: No  . Drug use: No  . Sexual activity: Not on file  Other Topics Concern  . Not on file  Social History Narrative  . Not on file    FAMILY HISTORY:  Family History  Problem Relation Age of Onset  . Heart failure Mother   . Hypertension Mother   . Kidney failure Father   . Hypertension Father   . Diabetes Brother   . Hypertension  Brother     CURRENT MEDICATIONS:  Outpatient Encounter Medications as of 07/09/2017  Medication Sig Note  . allopurinol (ZYLOPRIM) 100 MG tablet Take 100 mg daily by mouth.  03/26/2017: [facility], [place], [place]  . aspirin EC 81 MG tablet Take by mouth. 03/26/2017: [facility], [place], [place]  . calcium acetate (PHOSLO) 667 MG capsule Take 1 capsule 4 (four) times daily by mouth.   . Cholecalciferol (VITAMIN D3) 1000 units CAPS Take 1,000 Units by mouth daily.   Marland Kitchen levothyroxine (SYNTHROID, LEVOTHROID) 88 MCG tablet Take by mouth. 03/26/2017: [facility], [place], [place]  . loperamide (IMODIUM) 2 MG capsule Take 1 capsule (2 mg total) by mouth every 4 (four) hours as needed for diarrhea or loose stools.   Marland Kitchen losartan (COZAAR) 50 MG tablet Take 1 tablet daily by mouth. 03/26/2017: [facility], [place], [place]  . metoprolol succinate (TOPROL-XL) 100 MG 24 hr tablet Take 100 mg daily by mouth.  03/26/2017: [facility], [place], [place]  . pravastatin (PRAVACHOL) 40 MG tablet Take 40 mg every evening by mouth.  03/26/2017: [facility], [place], [place]  . predniSONE (DELTASONE) 10 MG tablet Take 10 mg by mouth daily with breakfast.    No facility-administered encounter medications on file as of 07/09/2017.     ALLERGIES:  Allergies  Allergen Reactions  . Lisinopril Other (See Comments)    hyperkalemia hyperkalemia     PHYSICAL EXAM:  ECOG Performance status: ***  There were no vitals filed for this visit. There were no vitals filed for this visit.  Physical Exam   LABORATORY DATA:  I have reviewed the labs as listed.  CBC    Component Value Date/Time   WBC 2.6 (L) 06/20/2017 1447   RBC 2.46 (L) 06/20/2017 1447   HGB 7.9 (L) 06/20/2017 1447   HCT 24.1 (L) 06/20/2017 1447   PLT 85 (L) 06/20/2017 1447   MCV 98.0 06/20/2017 1447   MCH 32.1 06/20/2017 1447   MCHC 32.8 06/20/2017 1447   RDW 19.4 (H) 06/20/2017 1447   LYMPHSABS 0.6 (L) 06/20/2017 1447   MONOABS 0.0 (L) 06/20/2017 1447   EOSABS 0.0 06/20/2017  1447   BASOSABS 0.0 06/20/2017 1447   CMP Latest Ref Rng & Units 06/05/2017 05/22/2017 04/09/2017  Glucose 65 - 99 mg/dL 121(H) 106(H) 109(H)  BUN 6 - 20 mg/dL 25(H) 43(H) 65(H)  Creatinine 0.61 - 1.24 mg/dL 3.66(H) 6.24(H) 8.54(H)  Sodium 135 - 145 mmol/L 140 141 141  Potassium 3.5 - 5.1 mmol/L 3.0(L) 3.1(L) 3.9  Chloride 101 - 111 mmol/L 93(L) 97(L) 99(L)  CO2 22 - 32 mmol/L 32 29 29  Calcium 8.9 - 10.3 mg/dL 8.2(L) 8.3(L) 8.0(L)  Total Protein 6.5 - 8.1 g/dL 7.5 7.6 7.2  Total Bilirubin 0.3 - 1.2 mg/dL 0.3 0.6 0.6  Alkaline Phos 38 - 126 U/L 78 87 97  AST 15 - 41 U/L 49(H) 50(H) 81(H)  ALT 17 - 63 U/L 44 46 89(H)    PENDING LABS:    DIAGNOSTIC IMAGING:  *The following  radiologic images and reports have been reviewed independently and agree with below findings.  ***  PATHOLOGY:  ***   ASSESSMENT & PLAN:   Myelofibrosis, JAK2+, transfusion-dependent:  -Diagnosed in 06/2012 and is now transfusion-dependent.  Historically poor tolerance to [facility]. Also with iron overload secondary to repeated blood transfusions; unable to tolerate Deferiprone and also stopped d/t renal failure.    -Late last week, pt's wife called clinic requesting blood transfusion because he was told at dialysis that his Hgb was in the 5 range.  He was instructed to go to the closest ER at that time. Pt lives in [place] and it is becoming increasingly difficult for him to come to the cancer center for frequent blood transfusions, while maintaining his dialysis schedule 3x/week.   -***  Goals of care:  -We spent some time today discussing his current clinical situation. He is able to recognize that he is growing more weak and tired, particularly in the past few weeks/months. We again broached the subject of hospice. Discussed with him the benefits of hospice services, including managing his symptoms at home. Unfortunately, hospice services do not include blood transfusions. ***     Dispo:  -   All  questions were answered to patient's stated satisfaction. Encouraged patient to call with any new concerns or questions before his next visit to the cancer center and we can certain see him sooner, if needed.    Plan of care discussed with Dr. ***, who agrees with the above aforementioned.    Orders placed this encounter:  No orders of the defined types were placed in this encounter.     Mike Craze, NP [place] [phone]

## 2017-07-09 ENCOUNTER — Other Ambulatory Visit (HOSPITAL_COMMUNITY): Payer: Medicare HMO

## 2017-07-09 ENCOUNTER — Ambulatory Visit (HOSPITAL_COMMUNITY): Payer: Medicare HMO | Admitting: Adult Health

## 2018-11-12 DEATH — deceased
